# Patient Record
Sex: Female | Born: 1990 | Race: Black or African American | Hispanic: No | Marital: Single | Smoking: Never smoker
Health system: Southern US, Community
[De-identification: ages and names within clinical notes are randomized; demographics above are authoritative.]

## PROBLEM LIST (undated history)

## (undated) DIAGNOSIS — R29898 Other symptoms and signs involving the musculoskeletal system: Secondary | ICD-10-CM

## (undated) DIAGNOSIS — A609 Anogenital herpesviral infection, unspecified: Secondary | ICD-10-CM

## (undated) DIAGNOSIS — F53 Postpartum depression: Secondary | ICD-10-CM

## (undated) DIAGNOSIS — Z789 Other specified health status: Secondary | ICD-10-CM

## (undated) HISTORY — DX: Postpartum depression: F53.0

## (undated) HISTORY — DX: Other symptoms and signs involving the musculoskeletal system: R29.898

---
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
## 2010-03-08 ENCOUNTER — Other Ambulatory Visit (HOSPITAL_COMMUNITY)
Admission: RE | Admit: 2010-03-08 | Discharge: 2010-03-08 | Disposition: A | Discharge status: Home / Self Care | Payer: Self-pay | Source: Ambulatory Visit | Attending: Obstetrics and Gynecology | Admitting: Obstetrics and Gynecology

## 2010-03-08 ENCOUNTER — Other Ambulatory Visit: Payer: Self-pay | Admitting: Obstetrics and Gynecology

## 2010-03-08 DIAGNOSIS — Z113 Encounter for screening for infections with a predominantly sexual mode of transmission: Secondary | ICD-10-CM | POA: Insufficient documentation

## 2010-03-08 DIAGNOSIS — Z01419 Encounter for gynecological examination (general) (routine) without abnormal findings: Secondary | ICD-10-CM | POA: Insufficient documentation

## 2010-05-30 NOTE — Op Note (Signed)
Jillian Ruiz, Jillian Ruiz              ACCOUNT NO.:  [id number]   MEDICAL RECORD NO.:  [id number]          PATIENT TYPE:  INP   LOCATION:  9137                          FACILITY:  [facility]   PHYSICIAN:  Gerald Leitz, MD          DATE OF BIRTH:  05/09/1990   DATE OF PROCEDURE:  02/06/2007  DATE OF DISCHARGE:                               OPERATIVE REPORT   PREOPERATIVE DIAGNOSES:  1. 39-2/7-week intrauterine pregnancy.  2. Active labor.  3. Nonreassuring fetal heart rate.   POSTOPERATIVE DIAGNOSES:  1. 39-2/7-week intrauterine pregnancy.  2. Active labor.  3. Nonreassuring fetal heart rate.   PROCEDURE:  Primary low transverse cesarean section.   SURGEON:  Dr. Gerald Leitz.   ASSISTANT:  Charles A. Sydnee Cabal, MD   ANESTHESIA:  General.   FINDINGS:  Female infant, cephalic presentation.  Apgars were 8 and 9 at  one and five minutes respectively.   ESTIMATED BLOOD LOSS:  600 mL.   FLUIDS:  3000s of lactated Ringer's.   URINE OUTPUT:  300 mL.   COMPLICATIONS:  None.   SPECIMEN:  Placenta.   DISPOSITION:  Specimen sent to pathology.   INDICATIONS:  This is a an 20 year old G1 at 39-2/7 weeks intrauterine  pregnancy in active labor with a maximal cervical dilatation of 4 cm.  She developed fetal decelerations to the 90s lasting for four minutes.  These were recurrent, she was consented for cesarean section.   PROCEDURE:  The patient was taken to the operating room where spinal  anesthesia was attempted unsuccessfully.  She was then placed under  general anesthesia.  Prior to intubation the patient was prepped and  draped in the usual sterile fashion.  Once the patient was intubated the  Pfannenstiel skin incision was made with a scalpel.  This incision was  extended laterally with Mayo scissors.  The superior aspect of the  fascial incision was elevated with Kocher clamps and underside rectus  muscles were dissected off with Mayo scissors.  This was repeated on the  inferior aspect  of the fascial incision.  The rectus muscles separated  in the midline.  The peritoneum was identified and entered bluntly.  The  peritoneal incision was extended superiorly and inferiorly with good  visualization of the bladder.  Bladder blade was inserted.  Vesicouterine peritoneum was identified, tented up and entered sharply  with Metzenbaum scissors.  The incision was extended laterally.  Low  transverse incision was made with scalpel.  The infant's head was  delivered, the mouth and nose were bulb suctioned.  The cord was clamped  x2 and cut.  The infant was handed off to the waiting neonatologist.  Arterial blood gases were obtained.  The placenta was then expressed.  The uterus was exteriorized, cleared of all clot and debris.  The  uterine incision was repaired with 0-0 Vicryl in a running locked  fashion.  Excellent hemostasis was noted.  The uterus was returned to  the abdomen.  The abdomen was copiously irrigated, cleared of all clots  and debris.  Fascia was reapproximated with 0-0 PDS.  Scarpa fascia was  reapproximated with 2-0 plain gut in a running fashion.  The skin was  reapproximated with staples.  Sponge, lap and needle counts were correct  x2.  2 grams of Ancef were given at cord clamp.  The patient was  awakened from general anesthesia and taken to the recovery room awake  and in stable condition.      Gerald Leitz, MD  Electronically Signed     TC/MEDQ  D:  02/06/2007  T:  02/06/2007  Job:  [id number]

## 2010-06-02 NOTE — Discharge Summary (Signed)
NAMEVERALYN, LOPP              ACCOUNT NO.:  [id number]   MEDICAL RECORD NO.:  [id number]          PATIENT TYPE:  INP   LOCATION:  9137                          FACILITY:  [facility]   PHYSICIAN:  Gerald Leitz, MD          DATE OF BIRTH:  05/11/1990   DATE OF ADMISSION:  02/06/2007  DATE OF DISCHARGE:  02/09/2007                               DISCHARGE SUMMARY   ADMISSION DIAGNOSES:  1. A 39-2/7 week intrauterine pregnancy.  2. Active labor.   DISCHARGE DIAGNOSES:  1. A 39-2/7 week intrauterine pregnancy.  2. Active labor.  3. Nonreassuring fetal heart rate.  4. Status post low-transverse cesarean section.   HOSPITAL COURSE:  The patient was admitted on February 06, 2007, in  active labor.  She developed nonreassuring fetal heart rate with a  maximum cervical dilatation of 4 cm.  She underwent a low-transverse  cesarean section on February 06, 2007, delivered a live born female infant  in cephalic presentation with Apgars of 8 and 9 at 1 and 5 minutes  respectively.  She did well postoperatively.  Hemoglobin on postop day  #1 was 10.l with hematocrit of 29.9.  She was discharged home on postop  day #3 in stable condition on the following medications, Motrin,  Micronor, and iron sulfate as well as Percocet.  She will follow up in  our office on February 10, 2007, for staple removal and in 6 weeks for a  postpartum visit.   CONDITION ON DISCHARGE:  Stable.      Gerald Leitz, MD  Electronically Signed     TC/MEDQ  D:  04/08/2007  T:  04/09/2007  Job:  [id number]

## 2010-10-05 LAB — CBC
HCT: 29.9 — ABNORMAL LOW
HCT: 33.4 — ABNORMAL LOW
Hemoglobin: 10.1 — ABNORMAL LOW
Hemoglobin: 11.4 — ABNORMAL LOW
MCHC: 34
MCHC: 34.1
MCV: 82.6
MCV: 82.8
Platelets: 178
Platelets: 181
RBC: 3.6 — ABNORMAL LOW
RBC: 4.05
RDW: 12.9
RDW: 13.1
WBC: 10.2
WBC: 11.5 — ABNORMAL HIGH

## 2010-10-05 LAB — RPR: RPR Ser Ql: NONREACTIVE

## 2013-02-19 ENCOUNTER — Other Ambulatory Visit (HOSPITAL_COMMUNITY)
Admission: RE | Admit: 2013-02-19 | Discharge: 2013-02-19 | Disposition: A | Discharge status: Home / Self Care | Payer: PRIVATE HEALTH INSURANCE | Source: Ambulatory Visit | Attending: Obstetrics and Gynecology | Admitting: Obstetrics and Gynecology

## 2013-02-19 ENCOUNTER — Other Ambulatory Visit: Payer: Self-pay | Admitting: Obstetrics and Gynecology

## 2013-02-19 DIAGNOSIS — Z1151 Encounter for screening for human papillomavirus (HPV): Secondary | ICD-10-CM | POA: Insufficient documentation

## 2013-02-19 DIAGNOSIS — Z124 Encounter for screening for malignant neoplasm of cervix: Secondary | ICD-10-CM | POA: Insufficient documentation

## 2013-02-19 DIAGNOSIS — Z113 Encounter for screening for infections with a predominantly sexual mode of transmission: Secondary | ICD-10-CM | POA: Insufficient documentation

## 2013-06-26 LAB — OB RESULTS CONSOLE ABO/RH: RH TYPE: POSITIVE

## 2013-06-26 LAB — OB RESULTS CONSOLE HIV ANTIBODY (ROUTINE TESTING): HIV: NONREACTIVE

## 2013-06-26 LAB — OB RESULTS CONSOLE ANTIBODY SCREEN: Antibody Screen: NEGATIVE

## 2013-07-22 LAB — OB RESULTS CONSOLE GC/CHLAMYDIA
Chlamydia: NEGATIVE
Gonorrhea: NEGATIVE

## 2013-09-06 ENCOUNTER — Telehealth: Payer: Self-pay | Admitting: Obstetrics and Gynecology

## 2013-09-06 NOTE — Telephone Encounter (Signed)
TC from patient--18 weeks, G2P1, patient of Dr. Richardson Dopp. 3 days of episodes of sweating, shaking, "feeling bad". Reports fever to 102.1 on Friday, but no recurrence.  Did not call office. Able to drink fluids, has been able to eat, but has less appetite this weekend than usual. Denies vomiting, has had mild nausea.  Denies any medical issues with diabetes, HTN, etc, prior to or during pregnancy. Reviewed possibility of viral illness, changes in blood sugar, dehydration, thyroid issues, BP issues as possible causes. Offered evaluation in MAU today, or evaluation at Dr. Dawayne Patricia office tomorrow. Patient elects office visit tomorrow.  I will call office in the am and inform of patient call, and request they call patient to f/u and plan evaluation. Patient to call prior to the am if any worsening of sx.  Nigel Bridgeman, CNM 09/06/13 5:55p

## 2013-11-17 LAB — OB RESULTS CONSOLE RPR: RPR: NONREACTIVE

## 2014-01-11 ENCOUNTER — Inpatient Hospital Stay (HOSPITAL_COMMUNITY)
Admission: AD | Admit: 2014-01-11 | Discharge: 2014-01-11 | Disposition: A | Discharge status: Home / Self Care | Payer: Medicaid Other | Source: Ambulatory Visit | Attending: Obstetrics & Gynecology | Admitting: Obstetrics & Gynecology

## 2014-01-11 ENCOUNTER — Encounter (HOSPITAL_COMMUNITY): Payer: Self-pay | Admitting: *Deleted

## 2014-01-11 DIAGNOSIS — O9989 Other specified diseases and conditions complicating pregnancy, childbirth and the puerperium: Secondary | ICD-10-CM | POA: Diagnosis present

## 2014-01-11 DIAGNOSIS — Z3A36 36 weeks gestation of pregnancy: Secondary | ICD-10-CM | POA: Diagnosis not present

## 2014-01-11 DIAGNOSIS — R1032 Left lower quadrant pain: Secondary | ICD-10-CM | POA: Insufficient documentation

## 2014-01-11 HISTORY — DX: Other specified health status: Z78.9

## 2014-01-11 NOTE — MAU Note (Signed)
Pt states has had BH ctx's earlier in the week, however none at present. Intermittent twinges.

## 2014-01-11 NOTE — MAU Provider Note (Signed)
Ms. Jillian Ruiz is a 23 y.o. G2P1001 at 423w2d  who presents to MAU today complaining LOF since earlier tonight. The patient states light spotting a few days ago, but denies bleeding today. She states mild occasional LLQ cramping. She denies contractions or complications with this pregnancy. She states a history of emergency C/S with last pregnancy for decreased FHR. She reports good fetal movement.   BP 120/66 mmHg  Pulse 88  Temp(Src) 99.6 F (37.6 C) (Oral)  Resp 18  Ht 5\' 5"  (1.651 m)  Wt 256 lb (116.121 kg)  BMI 42.60 kg/m2  SpO2 100% GENERAL: Well-developed, well-nourished female in no acute distress.  HEENT: Normocephalic, atraumatic.  LUNGS: Normal effort HEART: Regular rate  ABDOMEN: Soft, nontender, nondistended.  PELVIC: Normal external female genitalia. Vagina is pink and rugated.  Small amount of thin white discharge with mild odor. Negative pooling. Gravid uterus.   EXTREMITIES: No cyanosis, clubbing, or edema  Fern - negative  Fetal Monitoring:  Baseline: 120 bpm, moderate variability, + accelerations, no decelerations Contractions: none  A: Membranes intact  P: Report given to RN to contact MD on call for further instructions  Marny LowensteinJulie N Fidelis Loth, PA-C 01/11/2014 8:21 PM

## 2014-01-11 NOTE — MAU Note (Signed)
Pt states due 02/06/2014, scheduled repeat C/S 02/01/2014 with Dr. Richardson Doppole.

## 2014-01-11 NOTE — Discharge Instructions (Signed)
Premature Rupture and Preterm Premature Rupture of Membranes °A sac made up of membranes surrounds your baby in the womb (uterus). When this sac breaks before contractions or labor starts, it is called premature rupture of membranes (PROM). Rupture of membranes is also known as your water breaking. If this happens before 37 weeks, it is called preterm premature rupture of membranes (PPROM). PPROM is serious. It needs medical care right away. °CAUSES  °PROM may be caused by the membranes getting weak. This happens at the end of pregnancy. PPROM is often due to an infection, but can be caused by a number of other things.  °SIGNS OF PROM OR PPROM °· A sudden gush of fluid from the vagina. °· A slow leak of fluid from the vagina. °· Your underwear stay wet. °WHAT TO DO IF YOU THINK YOUR WATER BROKE °Call your doctor right away. You will need to go to the hospital to get checked right away. °WHAT HAPPENS IF YOU ARE TOLD YOU HAVE PROM OR PPROM? °You will have tests done at the hospital. If you have PROM, you may be given medicine to start labor (induced). This may happen if you are not having contractions within 24 hours of your water breaking. If you have PPROM and are not having contractions, you may be given medicine to start labor. It will depend on how far along you are in your pregnancy. °If you have PPROM, you: °· And your baby will be watched closely for signs of infection or other problems. °· May be given an antibiotic medicine. This can stop an infection from starting. °· May be given a steroid medicine. This can help the lungs to develop faster. °· May be given a medicine to stop early labor (preterm labor). °· May be told to stay in bed except to use the restroom (bed rest). °· May be given medicine to start labor. This can happen if there are problems with you or the baby. °Your treatment will depend on many factors. °Document Released: 03/30/2008 Document Revised: 09/03/2012 Document Reviewed:  04/22/2012 °[employer]® Patient Information ©2015 [employer]. This information is not intended to replace advice given to you by your health care provider. Make sure you discuss any questions you have with your health care provider. ° °

## 2014-01-11 NOTE — MAU Note (Signed)
?   Leaking started at 1645- clear fluid small amts still coming, no bleeding, some twinges- no pain

## 2014-01-29 ENCOUNTER — Encounter (HOSPITAL_COMMUNITY)
Admission: RE | Admit: 2014-01-29 | Discharge: 2014-01-29 | Disposition: A | Discharge status: Home / Self Care | Payer: Medicaid Other | Source: Ambulatory Visit | Attending: Obstetrics and Gynecology | Admitting: Obstetrics and Gynecology

## 2014-01-29 ENCOUNTER — Encounter (HOSPITAL_COMMUNITY): Payer: Self-pay

## 2014-01-29 LAB — CBC
HEMATOCRIT: 33.2 % — AB (ref 36.0–46.0)
Hemoglobin: 11 g/dL — ABNORMAL LOW (ref 12.0–15.0)
MCH: 27.2 pg (ref 26.0–34.0)
MCHC: 33.1 g/dL (ref 30.0–36.0)
MCV: 82.2 fL (ref 78.0–100.0)
Platelets: 171 10*3/uL (ref 150–400)
RBC: 4.04 MIL/uL (ref 3.87–5.11)
RDW: 12.8 % (ref 11.5–15.5)
WBC: 7.9 10*3/uL (ref 4.0–10.5)

## 2014-01-29 LAB — ABO/RH: ABO/RH(D): B POS

## 2014-01-29 LAB — TYPE AND SCREEN
ABO/RH(D): B POS
Antibody Screen: NEGATIVE

## 2014-01-29 NOTE — Patient Instructions (Signed)
   Your procedure is scheduled on: JAN 18 AT 730AM  Enter through the [facility]trance of [facility] at: 6AM Pick up the phone at the desk and dial [phone] and inform us of your arrival.  Please call this number if you have any problems the morning of surgery: [phone]  Remember: Do not eat food after midnight:JAN 17 Do not drink clear liquids after: JAN 17 Take these medicines the morning of surgery with a SIP OF WATER: NONE  Do not wear jewelry, make-up, or FINGER nail polish No metal in your hair or on your body. Do not wear lotions, powders, perfumes.  You may wear deodorant.  Do not bring valuables to the hospital. Contacts, dentures or bridgework may not be worn into surgery.  Leave suitcase in the car. After Surgery it may be brought to your room. For patients being admitted to the hospital, checkout time is 11:00am the day of discharge.    Patients discharged on the day of surgery will not be allowed to drive home.

## 2014-01-30 ENCOUNTER — Other Ambulatory Visit: Payer: Self-pay | Admitting: Obstetrics and Gynecology

## 2014-01-30 LAB — RPR: RPR Ser Ql: NONREACTIVE

## 2014-01-31 NOTE — H&P (Signed)
Jillian Ruiz is a 24 y.o. female presenting for Repeat cesarean section  At 39 wks and 2 days. Her EDD is 02/06/2014 by lmp confirmed by 11 wk ultrasound. Pregnancy has been uncomplicated. She recieves prenatal care with Dr. Gerald Leitzara Beatrice Sehgal with [facility].     History OB History    Gravida Para Term Preterm AB TAB SAB Ectopic Multiple Living   2 1 1       1      Past Medical History  Diagnosis Date  . Medical history non-contributory    Past Surgical History  Procedure Laterality Date  . Cesarean section     Family History: family history is not on file. Social History:  reports that she has never smoked. She has never used smokeless tobacco. She reports that she does not drink alcohol or use illicit drugs.   Prenatal Transfer Tool  Maternal Diabetes: No Genetic Screening: Normal Maternal Ultrasounds/Referrals: Normal Fetal Ultrasounds or other Referrals:  None Maternal Substance Abuse:  No Significant Maternal Medications:  None Significant Maternal Lab Results:  Lab values include: Group B Strep negative Other Comments:  None  Review of Systems  All other systems reviewed and are negative.     There were no vitals taken for this visit. Maternal Exam:  Abdomen: Surgical scars: low transverse.   Estimated fetal weight is 7 lb 8 oz .   Fetal presentation: vertex  Introitus: Normal vulva. Normal vagina.    Fetal Exam Fetal Monitor Review: Mode: hand-held doppler probe.   Baseline rate: 145.      Physical Exam  Vitals reviewed. Constitutional: She is oriented to person, place, and time. She appears well-developed and well-nourished.  HENT:  Head: Normocephalic.  Cardiovascular: Normal rate and regular rhythm.   Respiratory: Effort normal and breath sounds normal.  GI: There is no tenderness.  Musculoskeletal: Normal range of motion.  Neurological: She is alert and oriented to person, place, and time.  Skin: Skin is warm and dry.  Psychiatric: She has a normal  mood and affect.    Prenatal labs: ABO, Rh: --/--/B POS, B POS (01/15 16100922) Antibody: NEG (01/15 0922) Rubella:  Immune   RPR: Non Reactive (01/15 0923)  HBsAg:   Negative  HIV: Non-reactive (06/12 0000)  GBS:   Negative   Assessment/Plan: 39 wks and 2 days with h/o cesarean section pt desires repeat. R/b/a of cesarean section discussed with the patient including but not limited to infection / bleeding / damage to bowel bladder baby with need for further surgery. R/O tranfusion discussed.. Pt voiced understanding and desires to proceed.    Kebrina Friend J. 01/31/2014, 7:16 PM

## 2014-02-01 ENCOUNTER — Inpatient Hospital Stay (HOSPITAL_COMMUNITY)
Admission: RE | Admit: 2014-02-01 | Discharge: 2014-02-03 | DRG: 765 | Disposition: A | Discharge status: Home / Self Care | Payer: Medicaid Other | Source: Ambulatory Visit | Attending: Obstetrics and Gynecology | Admitting: Obstetrics and Gynecology

## 2014-02-01 ENCOUNTER — Encounter (HOSPITAL_COMMUNITY): Payer: Self-pay

## 2014-02-01 ENCOUNTER — Inpatient Hospital Stay (HOSPITAL_COMMUNITY): Payer: Medicaid Other | Admitting: Anesthesiology

## 2014-02-01 ENCOUNTER — Encounter (HOSPITAL_COMMUNITY)
Admission: RE | Disposition: A | Discharge status: Home / Self Care | Payer: Self-pay | Source: Ambulatory Visit | Attending: Obstetrics and Gynecology

## 2014-02-01 DIAGNOSIS — O99214 Obesity complicating childbirth: Secondary | ICD-10-CM | POA: Diagnosis present

## 2014-02-01 DIAGNOSIS — Z3A39 39 weeks gestation of pregnancy: Secondary | ICD-10-CM | POA: Diagnosis present

## 2014-02-01 DIAGNOSIS — O3421 Maternal care for scar from previous cesarean delivery: Principal | ICD-10-CM | POA: Diagnosis present

## 2014-02-01 DIAGNOSIS — Z98891 History of uterine scar from previous surgery: Secondary | ICD-10-CM | POA: Insufficient documentation

## 2014-02-01 DIAGNOSIS — Z6841 Body Mass Index (BMI) 40.0 and over, adult: Secondary | ICD-10-CM

## 2014-02-01 SURGERY — Surgical Case
Anesthesia: Spinal

## 2014-02-01 MED ORDER — IBUPROFEN 600 MG PO TABS
600.0000 mg | ORAL_TABLET | Freq: Four times a day (QID) | ORAL | Status: DC
  Administered 2014-02-01 – 2014-02-03 (×6): 600 mg via ORAL
  Filled 2014-02-01 (×7): qty 1

## 2014-02-01 MED ORDER — FENTANYL CITRATE 0.05 MG/ML IJ SOLN
INTRAMUSCULAR | Status: AC
  Filled 2014-02-01: qty 2

## 2014-02-01 MED ORDER — SCOPOLAMINE 1 MG/3DAYS TD PT72
1.0000 | MEDICATED_PATCH | Freq: Once | TRANSDERMAL | Status: DC
  Filled 2014-02-01: qty 1

## 2014-02-01 MED ORDER — NALBUPHINE HCL 10 MG/ML IJ SOLN: 5.0000 mg | Freq: Once | INTRAMUSCULAR | Status: AC | PRN

## 2014-02-01 MED ORDER — LANOLIN HYDROUS EX OINT: 1.0000 "application " | TOPICAL_OINTMENT | CUTANEOUS | Status: DC | PRN

## 2014-02-01 MED ORDER — MORPHINE SULFATE 0.5 MG/ML IJ SOLN
INTRAMUSCULAR | Status: AC
  Filled 2014-02-01: qty 10

## 2014-02-01 MED ORDER — WITCH HAZEL-GLYCERIN EX PADS: 1.0000 "application " | MEDICATED_PAD | CUTANEOUS | Status: DC | PRN

## 2014-02-01 MED ORDER — MENTHOL 3 MG MT LOZG: 1.0000 | LOZENGE | OROMUCOSAL | Status: DC | PRN

## 2014-02-01 MED ORDER — MORPHINE SULFATE (PF) 0.5 MG/ML IJ SOLN
INTRAMUSCULAR | Status: DC | PRN
  Administered 2014-02-01: .2 mg via INTRATHECAL

## 2014-02-01 MED ORDER — SIMETHICONE 80 MG PO CHEW
80.0000 mg | CHEWABLE_TABLET | Freq: Three times a day (TID) | ORAL | Status: DC
  Administered 2014-02-01 – 2014-02-03 (×5): 80 mg via ORAL
  Filled 2014-02-01 (×5): qty 1

## 2014-02-01 MED ORDER — ONDANSETRON HCL 4 MG/2ML IJ SOLN
4.0000 mg | Freq: Three times a day (TID) | INTRAMUSCULAR | Status: DC | PRN
  Administered 2014-02-01: 4 mg via INTRAVENOUS

## 2014-02-01 MED ORDER — SODIUM CHLORIDE 0.9 % IJ SOLN: 3.0000 mL | INTRAMUSCULAR | Status: DC | PRN

## 2014-02-01 MED ORDER — DIPHENHYDRAMINE HCL 25 MG PO CAPS
25.0000 mg | ORAL_CAPSULE | Freq: Four times a day (QID) | ORAL | Status: DC | PRN
  Administered 2014-02-01: 25 mg via ORAL
  Filled 2014-02-01: qty 1

## 2014-02-01 MED ORDER — BUPIVACAINE IN DEXTROSE 0.75-8.25 % IT SOLN
INTRATHECAL | Status: DC | PRN
  Administered 2014-02-01: 1.8 mg via INTRATHECAL

## 2014-02-01 MED ORDER — OXYTOCIN 10 UNIT/ML IJ SOLN
INTRAMUSCULAR | Status: AC
  Filled 2014-02-01: qty 4

## 2014-02-01 MED ORDER — LACTATED RINGERS IV SOLN
INTRAVENOUS | Status: DC
  Administered 2014-02-01 – 2014-02-02 (×2): via INTRAVENOUS

## 2014-02-01 MED ORDER — KETOROLAC TROMETHAMINE 30 MG/ML IJ SOLN
30.0000 mg | Freq: Four times a day (QID) | INTRAMUSCULAR | Status: AC | PRN
  Administered 2014-02-01: 30 mg via INTRAVENOUS
  Filled 2014-02-01: qty 1

## 2014-02-01 MED ORDER — METHYLERGONOVINE MALEATE 0.2 MG/ML IJ SOLN: 0.2000 mg | INTRAMUSCULAR | Status: DC | PRN

## 2014-02-01 MED ORDER — OXYCODONE-ACETAMINOPHEN 5-325 MG PO TABS
1.0000 | ORAL_TABLET | ORAL | Status: DC | PRN
  Administered 2014-02-02 (×4): 1 via ORAL
  Filled 2014-02-01 (×4): qty 1

## 2014-02-01 MED ORDER — PHENYLEPHRINE 8 MG IN D5W 100 ML (0.08MG/ML) PREMIX OPTIME
INJECTION | INTRAVENOUS | Status: AC
  Filled 2014-02-01: qty 100

## 2014-02-01 MED ORDER — FENTANYL CITRATE 0.05 MG/ML IJ SOLN: 25.0000 ug | INTRAMUSCULAR | Status: DC | PRN

## 2014-02-01 MED ORDER — ONDANSETRON HCL 4 MG/2ML IJ SOLN
INTRAMUSCULAR | Status: AC
  Filled 2014-02-01: qty 2

## 2014-02-01 MED ORDER — NALBUPHINE HCL 10 MG/ML IJ SOLN: 5.0000 mg | INTRAMUSCULAR | Status: DC | PRN

## 2014-02-01 MED ORDER — DIBUCAINE 1 % RE OINT: 1.0000 "application " | TOPICAL_OINTMENT | RECTAL | Status: DC | PRN

## 2014-02-01 MED ORDER — CEFAZOLIN SODIUM-DEXTROSE 2-3 GM-% IV SOLR
2.0000 g | INTRAVENOUS | Status: AC
  Administered 2014-02-01: 2 g via INTRAVENOUS

## 2014-02-01 MED ORDER — CEFAZOLIN SODIUM-DEXTROSE 2-3 GM-% IV SOLR
INTRAVENOUS | Status: AC
  Filled 2014-02-01: qty 50

## 2014-02-01 MED ORDER — LACTATED RINGERS IV SOLN
INTRAVENOUS | Status: DC | PRN
  Administered 2014-02-01: 08:00:00 via INTRAVENOUS

## 2014-02-01 MED ORDER — MEPERIDINE HCL 25 MG/ML IJ SOLN: 6.2500 mg | INTRAMUSCULAR | Status: DC | PRN

## 2014-02-01 MED ORDER — KETOROLAC TROMETHAMINE 30 MG/ML IJ SOLN: 30.0000 mg | Freq: Four times a day (QID) | INTRAMUSCULAR | Status: AC | PRN

## 2014-02-01 MED ORDER — OXYTOCIN 40 UNITS IN LACTATED RINGERS INFUSION - SIMPLE MED: 62.5000 mL/h | INTRAVENOUS | Status: AC

## 2014-02-01 MED ORDER — DIPHENHYDRAMINE HCL 50 MG/ML IJ SOLN: 12.5000 mg | INTRAMUSCULAR | Status: DC | PRN

## 2014-02-01 MED ORDER — ONDANSETRON HCL 4 MG/2ML IJ SOLN
4.0000 mg | INTRAMUSCULAR | Status: DC | PRN
Start: 2014-02-01 — End: 2014-02-03

## 2014-02-01 MED ORDER — ZOLPIDEM TARTRATE 5 MG PO TABS: 5.0000 mg | ORAL_TABLET | Freq: Every evening | ORAL | Status: DC | PRN

## 2014-02-01 MED ORDER — LACTATED RINGERS IV SOLN
Freq: Once | INTRAVENOUS | Status: AC
  Administered 2014-02-01: 07:00:00 via INTRAVENOUS

## 2014-02-01 MED ORDER — FERROUS SULFATE 325 (65 FE) MG PO TABS
325.0000 mg | ORAL_TABLET | Freq: Two times a day (BID) | ORAL | Status: DC
  Administered 2014-02-01 – 2014-02-03 (×4): 325 mg via ORAL
  Filled 2014-02-01 (×4): qty 1

## 2014-02-01 MED ORDER — PHENYLEPHRINE 8 MG IN D5W 100 ML (0.08MG/ML) PREMIX OPTIME
INJECTION | INTRAVENOUS | Status: DC | PRN
  Administered 2014-02-01: 60 ug/min via INTRAVENOUS

## 2014-02-01 MED ORDER — LACTATED RINGERS IV SOLN
INTRAVENOUS | Status: DC | PRN
  Administered 2014-02-01 (×2): via INTRAVENOUS

## 2014-02-01 MED ORDER — ONDANSETRON HCL 4 MG PO TABS: 4.0000 mg | ORAL_TABLET | ORAL | Status: DC | PRN

## 2014-02-01 MED ORDER — OXYTOCIN 10 UNIT/ML IJ SOLN
40.0000 [IU] | INTRAVENOUS | Status: DC | PRN
  Administered 2014-02-01: 40 [IU] via INTRAVENOUS

## 2014-02-01 MED ORDER — PRENATAL MULTIVITAMIN CH
1.0000 | ORAL_TABLET | Freq: Every day | ORAL | Status: DC
  Administered 2014-02-02: 1 via ORAL
  Filled 2014-02-01: qty 1

## 2014-02-01 MED ORDER — FENTANYL CITRATE 0.05 MG/ML IJ SOLN
INTRAMUSCULAR | Status: DC | PRN
  Administered 2014-02-01: 10 ug via INTRATHECAL

## 2014-02-01 MED ORDER — SIMETHICONE 80 MG PO CHEW: 80.0000 mg | CHEWABLE_TABLET | ORAL | Status: DC | PRN

## 2014-02-01 MED ORDER — SCOPOLAMINE 1 MG/3DAYS TD PT72
1.0000 | MEDICATED_PATCH | Freq: Once | TRANSDERMAL | Status: DC
  Administered 2014-02-01: 1.5 mg via TRANSDERMAL

## 2014-02-01 MED ORDER — NALOXONE HCL 1 MG/ML IJ SOLN
1.0000 ug/kg/h | INTRAMUSCULAR | Status: DC | PRN
  Filled 2014-02-01: qty 2

## 2014-02-01 MED ORDER — METHYLERGONOVINE MALEATE 0.2 MG PO TABS: 0.2000 mg | ORAL_TABLET | ORAL | Status: DC | PRN

## 2014-02-01 MED ORDER — MEDROXYPROGESTERONE ACETATE 150 MG/ML IM SUSP: 150.0000 mg | INTRAMUSCULAR | Status: DC | PRN

## 2014-02-01 MED ORDER — SCOPOLAMINE 1 MG/3DAYS TD PT72
MEDICATED_PATCH | TRANSDERMAL | Status: AC
  Administered 2014-02-01: 1.5 mg via TRANSDERMAL
  Filled 2014-02-01: qty 1

## 2014-02-01 MED ORDER — SIMETHICONE 80 MG PO CHEW
80.0000 mg | CHEWABLE_TABLET | ORAL | Status: DC
  Administered 2014-02-01 – 2014-02-02 (×2): 80 mg via ORAL
  Filled 2014-02-01 (×2): qty 1

## 2014-02-01 MED ORDER — SENNOSIDES-DOCUSATE SODIUM 8.6-50 MG PO TABS
2.0000 | ORAL_TABLET | ORAL | Status: DC
  Administered 2014-02-01 – 2014-02-02 (×2): 2 via ORAL
  Filled 2014-02-01 (×2): qty 2

## 2014-02-01 MED ORDER — NALOXONE HCL 0.4 MG/ML IJ SOLN: 0.4000 mg | INTRAMUSCULAR | Status: DC | PRN

## 2014-02-01 MED ORDER — ONDANSETRON HCL 4 MG/2ML IJ SOLN: 4.0000 mg | Freq: Once | INTRAMUSCULAR | Status: AC | PRN

## 2014-02-01 MED ORDER — ONDANSETRON HCL 4 MG/2ML IJ SOLN
INTRAMUSCULAR | Status: DC | PRN
  Administered 2014-02-01: 4 mg via INTRAVENOUS

## 2014-02-01 MED ORDER — DIPHENHYDRAMINE HCL 25 MG PO CAPS: 25.0000 mg | ORAL_CAPSULE | ORAL | Status: DC | PRN

## 2014-02-01 MED ORDER — OXYCODONE-ACETAMINOPHEN 5-325 MG PO TABS
2.0000 | ORAL_TABLET | ORAL | Status: DC | PRN
  Administered 2014-02-03: 2 via ORAL
  Filled 2014-02-01 (×2): qty 2

## 2014-02-01 SURGICAL SUPPLY — 47 items
APL SKNCLS STERI-STRIP NONHPOA (GAUZE/BANDAGES/DRESSINGS) ×1
BARRIER ADHS 3X4 INTERCEED (GAUZE/BANDAGES/DRESSINGS) ×2 IMPLANT
BENZOIN TINCTURE PRP APPL 2/3 (GAUZE/BANDAGES/DRESSINGS) ×3 IMPLANT
BRR ADH 4X3 ABS CNTRL BYND (GAUZE/BANDAGES/DRESSINGS) ×1
CLAMP CORD UMBIL (MISCELLANEOUS) IMPLANT
CLOSURE WOUND 1/2 X4 (GAUZE/BANDAGES/DRESSINGS) ×1
CLOTH BEACON ORANGE TIMEOUT ST (SAFETY) ×3 IMPLANT
CONTAINER PREFILL 10% NBF 15ML (MISCELLANEOUS) IMPLANT
DRAPE SHEET LG 3/4 BI-LAMINATE (DRAPES) IMPLANT
DRSG OPSITE POSTOP 4X10 (GAUZE/BANDAGES/DRESSINGS) ×3 IMPLANT
DURAPREP 26ML APPLICATOR (WOUND CARE) ×3 IMPLANT
ELECT REM PT RETURN 9FT ADLT (ELECTROSURGICAL) ×3
ELECTRODE REM PT RTRN 9FT ADLT (ELECTROSURGICAL) ×1 IMPLANT
EXTRACTOR VACUUM M CUP 4 TUBE (SUCTIONS) IMPLANT
EXTRACTOR VACUUM M CUP 4' TUBE (SUCTIONS)
GLOVE BIOGEL M 6.5 STRL (GLOVE) ×6 IMPLANT
GLOVE BIOGEL PI IND STRL 6.5 (GLOVE) ×1 IMPLANT
GLOVE BIOGEL PI INDICATOR 6.5 (GLOVE) ×2
GOWN STRL REUS W/TWL LRG LVL3 (GOWN DISPOSABLE) ×9 IMPLANT
KIT ABG SYR 3ML LUER SLIP (SYRINGE) IMPLANT
NEEDLE HYPO 25X5/8 SAFETYGLIDE (NEEDLE) IMPLANT
NS IRRIG 1000ML POUR BTL (IV SOLUTION) ×3 IMPLANT
PACK C SECTION WH (CUSTOM PROCEDURE TRAY) ×3 IMPLANT
PAD ABD 7.5X8 STRL (GAUZE/BANDAGES/DRESSINGS) ×2 IMPLANT
PAD ABD 8X7 1/2 STERILE (GAUZE/BANDAGES/DRESSINGS) ×3 IMPLANT
PAD OB MATERNITY 4.3X12.25 (PERSONAL CARE ITEMS) ×3 IMPLANT
RTRCTR C-SECT PINK 25CM LRG (MISCELLANEOUS) ×3 IMPLANT
RTRCTR C-SECT PINK 34CM XLRG (MISCELLANEOUS) IMPLANT
SPONGE GAUZE 4X4 12PLY STER LF (GAUZE/BANDAGES/DRESSINGS) ×3 IMPLANT
SPONGE LAP 18X18 X RAY DECT (DISPOSABLE) ×4 IMPLANT
STAPLER VISISTAT 35W (STAPLE) IMPLANT
STRIP CLOSURE SKIN 1/2X4 (GAUZE/BANDAGES/DRESSINGS) ×2 IMPLANT
SUT CHROMIC 2 0 CT 1 (SUTURE) ×2 IMPLANT
SUT CHROMIC 2 0 SH (SUTURE) ×2 IMPLANT
SUT PDS AB 0 CT1 27 (SUTURE) ×6 IMPLANT
SUT PLAIN 0 NONE (SUTURE) IMPLANT
SUT VIC AB 0 CTX 36 (SUTURE) ×9
SUT VIC AB 0 CTX36XBRD ANBCTRL (SUTURE) ×3 IMPLANT
SUT VIC AB 2-0 CT1 27 (SUTURE) ×3
SUT VIC AB 2-0 CT1 TAPERPNT 27 (SUTURE) ×1 IMPLANT
SUT VIC AB 3-0 SH 27 (SUTURE)
SUT VIC AB 3-0 SH 27X BRD (SUTURE) IMPLANT
SUT VIC AB 4-0 KS 27 (SUTURE) ×3 IMPLANT
SUT VICRYL 0 TIES 12 18 (SUTURE) ×2 IMPLANT
TAPE CLOTH SURG 4X10 WHT LF (GAUZE/BANDAGES/DRESSINGS) ×2 IMPLANT
TOWEL OR 17X24 6PK STRL BLUE (TOWEL DISPOSABLE) ×3 IMPLANT
TRAY FOLEY CATH 14FR (SET/KITS/TRAYS/PACK) ×3 IMPLANT

## 2014-02-01 NOTE — Anesthesia Procedure Notes (Signed)
Spinal Patient location during procedure: OR Start time: 02/01/2014 7:20 AM End time: 02/01/2014 7:25 AM Staffing Anesthesiologist: Milana Obey Performed by: anesthesiologist  Preanesthetic Checklist Completed: patient identified, site marked, surgical consent, pre-op evaluation, timeout performed, IV checked, risks and benefits discussed and monitors and equipment checked Spinal Block Patient position: sitting Prep: DuraPrep Patient monitoring: heart rate, continuous pulse ox and blood pressure Approach: midline Location: L3-4 Injection technique: single-shot Needle Needle type: Spinocan  Needle gauge: 24 G Needle length: 9 cm Assessment Sensory level: T4 Additional Notes Expiration date of kit checked and confirmed. Patient tolerated procedure well, without complications.  Functioning IV was confirmed and monitors were applied. Sterile prep and drape, including hand hygiene, mask and sterile gloves were used. The patient was positioned and the spine was prepped. The skin was anesthetized with lidocaine.  Free flow of clear CSF was obtained prior to injecting local anesthetic into the CSF.  The spinal needle aspirated freely following injection.  The needle was carefully withdrawn.  The patient tolerated the procedure well. Consent was obtained prior to procedure with all questions answered and concerns addressed. Risks including but not limited to bleeding, infection, nerve damage, paralysis, failed block, inadequate analgesia, allergic reaction, high spinal, itching and headache were discussed and the patient wished to proceed.   Lauretta Grill, MD

## 2014-02-01 NOTE — Op Note (Signed)
Cesarean Section Procedure Note  Indications: previous uterine incision low transverse cesarean section   Pre-operative Diagnosis: 39 week 2 day pregnancy.  Post-operative Diagnosis: same  Surgeon: Jessee AversOLE,Cayden Rautio J.   Assistants: Dr. Myna HidalgoJennifer Ozan assisted due to complexity of the case and concern for adhesions from previous surgery.   Anesthesia: Spinal anesthesia  ASA Class: 2   Procedure Details   The patient was seen in the Holding Room. The risks, benefits, complications, treatment options, and expected outcomes were discussed with the patient.  The patient concurred with the proposed plan, giving informed consent.  The site of surgery properly noted/marked. The patient was taken to Operating Room # 9, identified as Jillian Ruiz and the procedure verified as C-Section Delivery. A Time Out was held and the above information confirmed.  After induction of anesthesia, the patient was draped and prepped in the usual sterile manner. A Pfannenstiel incision was made and carried down through the subcutaneous tissue to the fascia. Fascial incision was made and extended transversely. The fascia was separated from the underlying rectus tissue superiorly and inferiorly. The peritoneum was identified and entered. Adhesions of the omentum to the peritoneum were doubly clamped transected and suture ligated with 0 vicryl.Once the adhesions were released. The  Peritoneal incision was extended longitudinally. An Alexis retractor was placed. The utero-vesical peritoneal reflection was incised transversely and the bladder flap was bluntly freed from the lower uterine segment. A low transverse uterine incision was made. Delivered from cephalic presentation was a   6 lb 15 oz   Female with Apgar scores of 9 at one minute and 9 at five minutes. After the umbilical cord was clamped and cut cord blood was obtained for evaluation. The placenta was removed intact and appeared normal. The uterine outline, tubes and  ovaries appeared normal. The uterine incision was closed with running locked sutures of 0 vicryl . Hemostasis was observed. Lavage was carried out until clear. Interseed was placed along the uterine incision. The muscle was reapproximated with 2-0 vicryl. The fascia was then reapproximated with running sutures of 0 pds.  The subcutaneous layer was reapproximated with 2-0 vicryl. The skin was reapproximated with 4-0 vicryl .  Instrument, sponge, and needle counts were correct prior the abdominal closure and at the conclusion of the case.   Findings: Female infant in the cephalic presentation. Normal fallopian tubes and ovaries. Adhesions of the omentum to the peritoneum. Adhesions of the bladder to the uterus above the lower uterine segment.   Estimated Blood Loss:  800 mL         Drains: Foley          Total IV Fluids:  Per anesthesia ml         Specimens: Placenta and sent to labor and delivery            Implants: None         Complications:  None; patient tolerated the procedure well.         Disposition: PACU - hemodynamically stable.         Condition: stable  Attending Attestation: I performed the procedure.

## 2014-02-01 NOTE — Transfer of Care (Signed)
Immediate Anesthesia Transfer of Care Note  Patient: Jillian Ruiz  Procedure(s) Performed: Procedure(s): REPEAT CESAREAN SECTION (N/A)  Patient Location: PACU  Anesthesia Type:Spinal  Level of Consciousness: awake, alert  and oriented  Airway & Oxygen Therapy: Patient Spontanous Breathing  Post-op Assessment: Report given to PACU RN  Post vital signs: Reviewed  Complications: No apparent anesthesia complications

## 2014-02-01 NOTE — Anesthesia Preprocedure Evaluation (Signed)
Anesthesia Evaluation  Patient identified by MRN, date of birth, ID band Patient awake    Reviewed: Allergy & Precautions, NPO status , Patient's Chart, lab work & pertinent test results  History of Anesthesia Complications Negative for: history of anesthetic complications  Airway Mallampati: III  TM Distance: >3 FB Neck ROM: Full    Dental no notable dental hx. (+) Dental Advisory Given   Pulmonary neg pulmonary ROS,  breath sounds clear to auscultation  Pulmonary exam normal       Cardiovascular negative cardio ROS  Rhythm:Regular Rate:Normal     Neuro/Psych negative neurological ROS  negative psych ROS   GI/Hepatic negative GI ROS, Neg liver ROS,   Endo/Other  Morbid obesity  Renal/GU negative Renal ROS  negative genitourinary   Musculoskeletal negative musculoskeletal ROS (+)   Abdominal   Peds negative pediatric ROS (+)  Hematology negative hematology ROS (+)   Anesthesia Other Findings   Reproductive/Obstetrics (+) Pregnancy                             Anesthesia Physical Anesthesia Plan  ASA: III  Anesthesia Plan: Spinal   Post-op Pain Management:    Induction:   Airway Management Planned:   Additional Equipment:   Intra-op Plan:   Post-operative Plan:   Informed Consent: I have reviewed the patients History and Physical, chart, labs and discussed the procedure including the risks, benefits and alternatives for the proposed anesthesia with the patient or authorized representative who has indicated his/her understanding and acceptance.   Dental advisory given  Plan Discussed with: CRNA  Anesthesia Plan Comments:         Anesthesia Quick Evaluation

## 2014-02-01 NOTE — Anesthesia Postprocedure Evaluation (Signed)
  Anesthesia Post-op Note  Patient: Jillian Ruiz  Procedure(s) Performed: Procedure(s) (LRB): REPEAT CESAREAN SECTION (N/A)  Patient Location: PACU  Anesthesia Type: Spinal  Level of Consciousness: awake and alert   Airway and Oxygen Therapy: Patient Spontanous Breathing  Post-op Pain: mild  Post-op Assessment: Post-op Vital signs reviewed, Patient's Cardiovascular Status Stable, Respiratory Function Stable, Patent Airway and No signs of Nausea or vomiting  Last Vitals:  Filed Vitals:   02/01/14 1030  BP: 91/54  Pulse: 69  Temp: 36.5 C  Resp: 18    Post-op Vital Signs: stable   Complications: No apparent anesthesia complications

## 2014-02-01 NOTE — H&P (Signed)
  Date of Initial H&P: 11/22/2014  History reviewed, patient examined, no change in status, stable for surgery. 

## 2014-02-01 NOTE — Lactation Note (Signed)
This note was copied from the chart of Jillian Bluford Mainshley Keckler. Lactation Consultation Note  Patient Name: Jillian Ruiz ZOXWR'UToday's Date: 02/01/2014  Pecola LeisureBaby is 6 hours old and has been to the breast several times and per mom recently breast fed for 60 mins. Per mom the baby was ina consistent pattern , with swallows. Dad changed a mec diaper when the LC present  And baby went back to sleep.  LC showed mom how to hand express, with her permission , several drops of Colostrum noted. LC explained the importance of having latch assessed qshift [facility] or LC .  Mother informed of post-discharge support and given phone number to the lactation department, including services for phone  call assistance; out-patient appointments; and breastfeeding support group. List of other breastfeeding resources in the community given  in the handout. Encouraged mother to call for problems or concerns related to breastfeeding.     Maternal Data Does the patient have breastfeeding experience prior to this delivery?: Yes  Feeding Feeding Type:  (baby recenly breast fed ) Length of feed: 30 min  LATCH Score/Interventions                Intervention(s): Breastfeeding basics reviewed     Lactation Tools Discussed/Used WIC Program: Yes (pe rmom [place] )   Consult Status Consult Status: Follow-up Date: 02/01/14 Follow-up type: In-patient    Kathrin Greathouseorio, Pravin Perezperez Ann 02/01/2014, 2:29 PM

## 2014-02-02 ENCOUNTER — Encounter (HOSPITAL_COMMUNITY): Payer: Self-pay | Admitting: Obstetrics and Gynecology

## 2014-02-02 LAB — CBC
HEMATOCRIT: 29.5 % — AB (ref 36.0–46.0)
HEMOGLOBIN: 10 g/dL — AB (ref 12.0–15.0)
MCH: 28 pg (ref 26.0–34.0)
MCHC: 33.9 g/dL (ref 30.0–36.0)
MCV: 82.6 fL (ref 78.0–100.0)
Platelets: 176 10*3/uL (ref 150–400)
RBC: 3.57 MIL/uL — ABNORMAL LOW (ref 3.87–5.11)
RDW: 12.5 % (ref 11.5–15.5)
WBC: 8.6 10*3/uL (ref 4.0–10.5)

## 2014-02-02 LAB — BIRTH TISSUE RECOVERY COLLECTION (PLACENTA DONATION)

## 2014-02-02 NOTE — Progress Notes (Signed)
Subjective: Postpartum Day 1: Cesarean Delivery Patient reports tolerating PO, + flatus and no problems voiding.    Objective: Vital signs in last 24 hours: Temp:  [97.9 F (36.6 C)-98.8 F (37.1 C)] 97.9 F (36.6 C) (01/19 0600) Pulse Rate:  [72-78] 72 (01/19 0600) Resp:  [18-20] 20 (01/19 0600) BP: (96-101)/(50-56) 101/52 mmHg (01/19 0600) SpO2:  [97 %-99 %] 99 % (01/19 0600)  Physical Exam:  General: alert and cooperative Lochia: appropriate Uterine Fundus: firm Incision: healing well DVT Evaluation: No evidence of DVT seen on physical exam.   Recent Labs  02/02/14 0540  HGB 10.0*  HCT 29.5*    Assessment/Plan: Status post Cesarean section. Doing well postoperatively.  Continue current care.  Jillian Ruiz J. 02/02/2014, 5:14 PM

## 2014-02-02 NOTE — Anesthesia Postprocedure Evaluation (Signed)
  Anesthesia Post-op Note  Anesthesia Post Note  Patient: Jillian Ruiz  Procedure(s) Performed: Procedure(s) (LRB): REPEAT CESAREAN SECTION (N/A)  Anesthesia type: Spinal  Patient location: Mother/Baby  Post pain: Pain level controlled  Post assessment: Post-op Vital signs reviewed  Last Vitals:  Filed Vitals:   02/02/14 0600  BP: 101/52  Pulse: 72  Temp: 36.6 C  Resp: 20    Post vital signs: Reviewed  Level of consciousness: awake  Complications: No apparent anesthesia complications

## 2014-02-02 NOTE — Addendum Note (Signed)
Addendum  created 02/02/14 1314 by Turner DanielsJennifer L Ahmaad Neidhardt, CRNA   Modules edited: Notes Section   Notes Section:  File: [id number]

## 2014-02-03 MED ORDER — OXYCODONE-ACETAMINOPHEN 5-325 MG PO TABS: 1.0000 | ORAL_TABLET | Freq: Four times a day (QID) | ORAL | Status: DC | PRN

## 2014-02-03 MED ORDER — IBUPROFEN 600 MG PO TABS: 600.0000 mg | ORAL_TABLET | Freq: Four times a day (QID) | ORAL | Status: DC | PRN

## 2014-02-03 NOTE — Discharge Summary (Signed)
Obstetric Discharge Summary Reason for Admission: cesarean section Prenatal Procedures: none Intrapartum Procedures: cesarean: low cervical, transverse Postpartum Procedures: none Complications-Operative and Postpartum: none HEMOGLOBIN  Date Value Ref Range Status  02/02/2014 10.0* 12.0 - 15.0 g/dL Final   HCT  Date Value Ref Range Status  02/02/2014 29.5* 36.0 - 46.0 % Final    Physical Exam:  General: alert and cooperative Lochia: appropriate Uterine Fundus: firm Incision: healing well DVT Evaluation: No evidence of DVT seen on physical exam.  Discharge Diagnoses: Term Pregnancy-delivered  Discharge Information: Date: 02/03/2014 Activity: pelvic rest Diet: routine Medications: PNV, Ibuprofen and Percocet Condition: stable Instructions: refer to practice specific booklet Discharge to: home Follow-up Information    Follow up with Jessee AversOLE,Zebulin Siegel J., MD In 2 weeks.   Specialty:  Obstetrics and Gynecology   Why:  incision check - pt already has an appt for february 1st    Contact information:   [address] [place] [address] [phone]       Newborn Data: Live born female  Birth Weight: 6 lb 15.1 oz (3150 g) APGAR: 8, 8  Home with mother.  Jillian Oetken J. 02/03/2014, 8:37 AM

## 2014-03-29 ENCOUNTER — Other Ambulatory Visit: Payer: Self-pay | Admitting: Obstetrics and Gynecology

## 2014-03-31 LAB — CYTOLOGY - PAP

## 2015-01-01 ENCOUNTER — Emergency Department (HOSPITAL_BASED_OUTPATIENT_CLINIC_OR_DEPARTMENT_OTHER)
Admission: EM | Admit: 2015-01-01 | Discharge: 2015-01-01 | Disposition: A | Discharge status: Home / Self Care | Payer: PRIVATE HEALTH INSURANCE | Attending: Emergency Medicine | Admitting: Emergency Medicine

## 2015-01-01 ENCOUNTER — Emergency Department (HOSPITAL_BASED_OUTPATIENT_CLINIC_OR_DEPARTMENT_OTHER): Payer: PRIVATE HEALTH INSURANCE

## 2015-01-01 ENCOUNTER — Encounter (HOSPITAL_BASED_OUTPATIENT_CLINIC_OR_DEPARTMENT_OTHER): Payer: Self-pay | Admitting: Emergency Medicine

## 2015-01-01 DIAGNOSIS — Z79899 Other long term (current) drug therapy: Secondary | ICD-10-CM | POA: Insufficient documentation

## 2015-01-01 DIAGNOSIS — J159 Unspecified bacterial pneumonia: Secondary | ICD-10-CM | POA: Insufficient documentation

## 2015-01-01 DIAGNOSIS — J189 Pneumonia, unspecified organism: Secondary | ICD-10-CM

## 2015-01-01 LAB — BASIC METABOLIC PANEL
Anion gap: 5 (ref 5–15)
BUN: 6 mg/dL (ref 6–20)
CALCIUM: 8.3 mg/dL — AB (ref 8.9–10.3)
CHLORIDE: 105 mmol/L (ref 101–111)
CO2: 28 mmol/L (ref 22–32)
CREATININE: 0.64 mg/dL (ref 0.44–1.00)
GFR calc non Af Amer: >60 mL/min (ref >60)
GLUCOSE: 118 mg/dL — AB (ref 65–99)
Potassium: 3.7 mmol/L (ref 3.5–5.1)
Sodium: 138 mmol/L (ref 135–145)

## 2015-01-01 LAB — CBC WITH DIFFERENTIAL/PLATELET
BASOS ABS: 0 10*3/uL (ref 0.0–0.1)
BASOS PCT: 0 %
Eosinophils Absolute: 0.2 10*3/uL (ref 0.0–0.7)
Eosinophils Relative: 2 %
HEMATOCRIT: 37.2 % (ref 36.0–46.0)
HEMOGLOBIN: 11.7 g/dL — AB (ref 12.0–15.0)
LYMPHS ABS: 2.7 10*3/uL (ref 0.7–4.0)
LYMPHS PCT: 25 %
MCH: 26.4 pg (ref 26.0–34.0)
MCHC: 31.5 g/dL (ref 30.0–36.0)
MCV: 84 fL (ref 78.0–100.0)
MONOS PCT: 4 %
Monocytes Absolute: 0.4 10*3/uL (ref 0.1–1.0)
NEUTROS ABS: 7.4 10*3/uL (ref 1.7–7.7)
Neutrophils Relative %: 69 %
Platelets: 252 10*3/uL (ref 150–400)
RBC: 4.43 MIL/uL (ref 3.87–5.11)
RDW: 12.3 % (ref 11.5–15.5)
WBC: 10.7 10*3/uL — ABNORMAL HIGH (ref 4.0–10.5)

## 2015-01-01 LAB — D-DIMER, QUANTITATIVE: D-Dimer, Quant: 0.45 ug/mL-FEU (ref 0.00–0.50)

## 2015-01-01 MED ORDER — BENZONATATE 100 MG PO CAPS: 100.0000 mg | ORAL_CAPSULE | Freq: Three times a day (TID) | ORAL | Status: DC

## 2015-01-01 MED ORDER — AZITHROMYCIN 250 MG PO TABS: 250.0000 mg | ORAL_TABLET | Freq: Every day | ORAL | Status: DC

## 2015-01-01 MED ORDER — ALBUTEROL SULFATE (2.5 MG/3ML) 0.083% IN NEBU
5.0000 mg | INHALATION_SOLUTION | Freq: Once | RESPIRATORY_TRACT | Status: AC
  Administered 2015-01-01: 5 mg via RESPIRATORY_TRACT
  Filled 2015-01-01: qty 6

## 2015-01-01 MED ORDER — ALBUTEROL SULFATE HFA 108 (90 BASE) MCG/ACT IN AERS
2.0000 | INHALATION_SPRAY | RESPIRATORY_TRACT | Status: DC | PRN
  Filled 2015-01-01: qty 6.7

## 2015-01-01 NOTE — ED Notes (Signed)
Patient instructed on albuterol use with spacer, no questions from patient. Pt understands use.

## 2015-01-01 NOTE — ED Provider Notes (Signed)
CSN: [id number]     Arrival date & time 01/01/15  1334 History  By signing my name below, I, Jillian Ruiz, attest that this documentation has been prepared under the direction and in the presence of Rolan Bucco, MD. Electronically Signed: Evon Slack, ED Scribe. 01/01/2015. 4:20 PM.      Chief Complaint  Patient presents with  . Hemoptysis    The history is provided by the patient. No language interpreter was used.    HPI Comments: Jillian Ruiz is a 24 y.o. female who presents to the Emergency Department complaining of worsening cough onset 1 week prior. Pt states that she has intermittent fever and myalgias. Pt states that her cough has recently worsened to a productive cough of blood tinged sputum, chocolate in color. She also reports having CP that's worse when breathing. Reports feeling slightly SOB at time and describes it as not being able to take a full breath at time. Pt denies vomiting or diarrhea. Pt does report that she is current on Mirena for birth control. Pt denies Hx of PE or DVT. Pt denies any recent long distance travel.     Past Medical History  Diagnosis Date  . Medical history non-contributory    Past Surgical History  Procedure Laterality Date  . Cesarean section    . Cesarean section N/A 02/01/2014    Procedure: REPEAT CESAREAN SECTION;  Surgeon: Dorien Chihuahua. Richardson Dopp, MD;  Location: [facility];  Service: Obstetrics;  Laterality: N/A;   History reviewed. No pertinent family history. Social History  Substance Use Topics  . Smoking status: Never Smoker   . Smokeless tobacco: Never Used  . Alcohol Use: No   OB History    Gravida Para Term Preterm AB TAB SAB Ectopic Multiple Living   0 1     Review of Systems  Constitutional: Positive for fever. Negative for chills, diaphoresis and fatigue.  HENT: Negative for congestion, rhinorrhea and sneezing.   Eyes: Negative.   Respiratory: Positive for cough, shortness of breath and wheezing. Negative for  chest tightness.   Cardiovascular: Positive for chest pain. Negative for leg swelling.  Gastrointestinal: Negative for nausea, vomiting, abdominal pain, diarrhea and blood in stool.  Genitourinary: Negative for frequency, hematuria, flank pain and difficulty urinating.  Musculoskeletal: Positive for myalgias. Negative for back pain and arthralgias.  Skin: Negative for rash.  Neurological: Negative for dizziness, speech difficulty, weakness, numbness and headaches.  All other systems reviewed and are negative.     Allergies  Review of patient's allergies indicates no known allergies.  Home Medications   Prior to Admission medications   Medication Sig Start Date End Date Taking? Authorizing Provider  azithromycin (ZITHROMAX) 250 MG tablet Take 1 tablet (250 mg total) by mouth daily. Take first 2 tablets together, then 1 every day until finished. 01/01/15   Rolan Bucco, MD  benzonatate (TESSALON) 100 MG capsule Take 1 capsule (100 mg total) by mouth every 8 (eight) hours. 01/01/15   Rolan Bucco, MD  Ca Carbonate-Mag Hydroxide (ROLAIDS PO) Take 1 tablet by mouth as needed (heartburn).    Historical Provider, MD  calcium carbonate (TUMS - DOSED IN MG ELEMENTAL CALCIUM) 500 MG chewable tablet Chew 2 tablets by mouth as needed for indigestion or heartburn.    Historical Provider, MD  ibuprofen (ADVIL,MOTRIN) 600 MG tablet Take 1 tablet (600 mg total) by mouth every 6 (six) hours as needed. 02/03/14   Gerald Leitz, MD  IRON PO  Take 1 tablet by mouth daily.    Historical Provider, MD  oxyCODONE-acetaminophen (PERCOCET/ROXICET) 5-325 MG per tablet Take 1-2 tablets by mouth every 6 (six) hours as needed (for pain scale less than 7). 02/03/14   Gerald Leitzara Cole, MD  Prenatal Vit-Fe Fumarate-FA (PRENATAL MULTIVITAMIN) TABS tablet Take 1 tablet by mouth daily at 12 noon.    Historical Provider, MD   BP 120/68 mmHg  Pulse 107  Temp(Src) 98.6 F (37 C) (Oral)  Resp 18  Ht 5' 4.5" (1.638 m)  Wt 249 lb  (112.946 kg)  BMI 42.10 kg/m2  SpO2 98%  LMP 12/13/2014   Physical Exam  Constitutional: She is oriented to person, place, and time. She appears well-developed and well-nourished.  HENT:  Head: Normocephalic and atraumatic.  Mouth/Throat: Oropharynx is clear and moist.  Eyes: Pupils are equal, round, and reactive to light.  Neck: Normal range of motion. Neck supple.  Cardiovascular: Normal rate, regular rhythm and normal heart sounds.   Pulmonary/Chest: Effort normal. No respiratory distress. She has wheezes (bilateral expiratory wheezes, no increased WOB). She has no rales. She exhibits no tenderness.  Abdominal: Soft. Bowel sounds are normal. There is no tenderness. There is no rebound and no guarding.  Musculoskeletal: Normal range of motion. She exhibits no edema.  No edema or calf tenderness  Lymphadenopathy:    She has no cervical adenopathy.  Neurological: She is alert and oriented to person, place, and time.  Skin: Skin is warm and dry. No rash noted.  Psychiatric: She has a normal mood and affect.    ED Course  Procedures (including critical care time) DIAGNOSTIC STUDIES: Oxygen Saturation is 99% on RA, normal by my interpretation.    COORDINATION OF CARE: 3:23 PM-Discussed treatment plan with pt at bedside and pt agreed to plan.     Labs Review Labs Reviewed  CBC WITH DIFFERENTIAL/PLATELET - Abnormal; Notable for the following:    WBC 10.7 (*)    Hemoglobin 11.7 (*)    All other components within normal limits  BASIC METABOLIC PANEL - Abnormal; Notable for the following:    Glucose, Bld 118 (*)    Calcium 8.3 (*)    All other components within normal limits  D-DIMER, QUANTITATIVE (NOT AT [facility])    Imaging Review Dg Chest 2 View  01/01/2015  CLINICAL DATA:  Cough and substernal chest pain for 1 week EXAM: CHEST - 2 VIEW COMPARISON:  None. FINDINGS: Cardiac shadow is within normal limits. The lungs are well aerated bilaterally. Minimal left basilar infiltrate  is seen projecting in the lower lobe. No acute bony abnormality is seen. IMPRESSION: Minimal left basilar infiltrate. Electronically Signed   By: Alcide CleverMark  Lukens M.D.   On: 01/01/2015 14:33   I have personally reviewed and evaluated these images and lab results as part of my medical decision-making.   EKG Interpretation None      MDM   Final diagnoses:  Community acquired pneumonia   Pt presents with cough and wheezing and slight hemoptysis.  She has no increased WOB. Her chest x-ray shows evidence of an infiltrate. She's otherwise well-appearing. She has some mild tachycardia but no tachypnea. She has normal oxygen saturations. Her d-dimer was normal with no other suggestions of pulmonary embolus. She was discharged in good condition. She has no ongoing hemoptysis. She was started on Zithromax and [employer] Perles. She was given albuterol neb in the ED and discharged with an inhaler.  She was advised to return if her symptoms worsen or  she has worsening hemoptysis.   I personally performed the services described in this documentation, which was scribed in my presence.  The recorded information has been reviewed and considered.      Rolan Bucco, MD 01/01/15 [phone]

## 2015-01-01 NOTE — ED Notes (Signed)
Presents with congested cough since last Saturday. States has had intermittent fever and sweating.

## 2015-01-01 NOTE — ED Notes (Signed)
Pt reports coughing up blood this am, wanted to be evaluated

## 2015-01-01 NOTE — Discharge Instructions (Signed)

## 2015-01-01 NOTE — ED Notes (Signed)
RT requested to assess pt also

## 2015-09-22 ENCOUNTER — Other Ambulatory Visit (HOSPITAL_COMMUNITY)
Admission: RE | Admit: 2015-09-22 | Discharge: 2015-09-22 | Disposition: A | Discharge status: Home / Self Care | Payer: BLUE CROSS/BLUE SHIELD | Source: Ambulatory Visit | Attending: Obstetrics and Gynecology | Admitting: Obstetrics and Gynecology

## 2015-09-22 ENCOUNTER — Other Ambulatory Visit: Payer: Self-pay | Admitting: Obstetrics and Gynecology

## 2015-09-22 DIAGNOSIS — Z113 Encounter for screening for infections with a predominantly sexual mode of transmission: Secondary | ICD-10-CM | POA: Diagnosis present

## 2015-09-22 DIAGNOSIS — Z01419 Encounter for gynecological examination (general) (routine) without abnormal findings: Secondary | ICD-10-CM | POA: Insufficient documentation

## 2015-09-23 LAB — CYTOLOGY - PAP

## 2016-12-18 ENCOUNTER — Emergency Department (HOSPITAL_BASED_OUTPATIENT_CLINIC_OR_DEPARTMENT_OTHER)
Admission: EM | Admit: 2016-12-18 | Discharge: 2016-12-18 | Disposition: A | Discharge status: Home / Self Care | Payer: No Typology Code available for payment source | Attending: Emergency Medicine | Admitting: Emergency Medicine

## 2016-12-18 ENCOUNTER — Encounter (HOSPITAL_BASED_OUTPATIENT_CLINIC_OR_DEPARTMENT_OTHER): Payer: Self-pay | Admitting: Emergency Medicine

## 2016-12-18 ENCOUNTER — Other Ambulatory Visit: Payer: Self-pay

## 2016-12-18 DIAGNOSIS — S81012A Laceration without foreign body, left knee, initial encounter: Secondary | ICD-10-CM | POA: Insufficient documentation

## 2016-12-18 DIAGNOSIS — Y998 Other external cause status: Secondary | ICD-10-CM | POA: Insufficient documentation

## 2016-12-18 DIAGNOSIS — M25562 Pain in left knee: Secondary | ICD-10-CM

## 2016-12-18 DIAGNOSIS — Y92410 Unspecified street and highway as the place of occurrence of the external cause: Secondary | ICD-10-CM | POA: Insufficient documentation

## 2016-12-18 DIAGNOSIS — Y9389 Activity, other specified: Secondary | ICD-10-CM | POA: Diagnosis not present

## 2016-12-18 MED ORDER — IBUPROFEN 800 MG PO TABS
800.0000 mg | ORAL_TABLET | Freq: Once | ORAL | Status: AC
  Administered 2016-12-18: 800 mg via ORAL
  Filled 2016-12-18: qty 1

## 2016-12-18 MED ORDER — ACETAMINOPHEN 500 MG PO TABS
1000.0000 mg | ORAL_TABLET | Freq: Once | ORAL | Status: AC
  Administered 2016-12-18: 1000 mg via ORAL
  Filled 2016-12-18: qty 2
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
## 2016-12-18 NOTE — Discharge Instructions (Signed)
Take 4 over the counter ibuprofen tablets 3 times a day or 2 over-the-counter naproxen tablets twice a day for pain. Also take tylenol 1000mg(2 extra strength) four times a day.    
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
## 2018-11-12 DIAGNOSIS — Z3201 Encounter for pregnancy test, result positive: Secondary | ICD-10-CM | POA: Diagnosis not present

## 2018-11-12 DIAGNOSIS — Z3480 Encounter for supervision of other normal pregnancy, unspecified trimester: Secondary | ICD-10-CM | POA: Diagnosis not present

## 2018-11-12 DIAGNOSIS — Z3A08 8 weeks gestation of pregnancy: Secondary | ICD-10-CM | POA: Diagnosis not present

## 2018-11-12 DIAGNOSIS — O26891 Other specified pregnancy related conditions, first trimester: Secondary | ICD-10-CM | POA: Diagnosis not present

## 2018-11-12 DIAGNOSIS — O26851 Spotting complicating pregnancy, first trimester: Secondary | ICD-10-CM | POA: Diagnosis not present

## 2018-11-12 DIAGNOSIS — Z3481 Encounter for supervision of other normal pregnancy, first trimester: Secondary | ICD-10-CM | POA: Diagnosis not present

## 2018-11-12 LAB — OB RESULTS CONSOLE GC/CHLAMYDIA
Chlamydia: NEGATIVE
Gonorrhea: NEGATIVE

## 2018-11-12 LAB — OB RESULTS CONSOLE HIV ANTIBODY (ROUTINE TESTING): HIV: NONREACTIVE

## 2018-11-12 LAB — OB RESULTS CONSOLE ANTIBODY SCREEN: Antibody Screen: NEGATIVE

## 2018-11-12 LAB — OB RESULTS CONSOLE ABO/RH: RH Type: POSITIVE

## 2018-11-12 LAB — OB RESULTS CONSOLE RUBELLA ANTIBODY, IGM: Rubella: IMMUNE

## 2018-11-12 LAB — OB RESULTS CONSOLE HEPATITIS B SURFACE ANTIGEN: Hepatitis B Surface Ag: NEGATIVE

## 2018-11-12 LAB — OB RESULTS CONSOLE RPR: RPR: NONREACTIVE

## 2018-11-26 ENCOUNTER — Other Ambulatory Visit (HOSPITAL_COMMUNITY)
Admission: RE | Admit: 2018-11-26 | Discharge: 2018-11-26 | Disposition: A | Discharge status: Home / Self Care | Payer: Medicaid Other | Source: Ambulatory Visit | Attending: Obstetrics and Gynecology | Admitting: Obstetrics and Gynecology

## 2018-11-26 ENCOUNTER — Other Ambulatory Visit: Payer: Self-pay | Admitting: Obstetrics and Gynecology

## 2018-11-26 DIAGNOSIS — Z3490 Encounter for supervision of normal pregnancy, unspecified, unspecified trimester: Secondary | ICD-10-CM | POA: Diagnosis not present

## 2018-11-27 DIAGNOSIS — O30041 Twin pregnancy, dichorionic/diamniotic, first trimester: Secondary | ICD-10-CM | POA: Diagnosis not present

## 2018-11-27 DIAGNOSIS — Z3A09 9 weeks gestation of pregnancy: Secondary | ICD-10-CM | POA: Diagnosis not present

## 2018-12-02 LAB — CYTOLOGY - PAP
Chlamydia: NEGATIVE
Comment: NEGATIVE
Comment: NEGATIVE
Comment: NORMAL
Diagnosis: NEGATIVE
High risk HPV: NEGATIVE
Neisseria Gonorrhea: NEGATIVE

## 2019-02-09 DIAGNOSIS — O30042 Twin pregnancy, dichorionic/diamniotic, second trimester: Secondary | ICD-10-CM | POA: Diagnosis not present

## 2019-02-09 DIAGNOSIS — Z3482 Encounter for supervision of other normal pregnancy, second trimester: Secondary | ICD-10-CM | POA: Diagnosis not present

## 2019-02-09 DIAGNOSIS — O365990 Maternal care for other known or suspected poor fetal growth, unspecified trimester, not applicable or unspecified: Secondary | ICD-10-CM | POA: Diagnosis not present

## 2019-02-09 DIAGNOSIS — O3090 Multiple gestation, unspecified, unspecified trimester: Secondary | ICD-10-CM | POA: Diagnosis not present

## 2019-02-09 DIAGNOSIS — Z36 Encounter for antenatal screening for chromosomal anomalies: Secondary | ICD-10-CM | POA: Diagnosis not present

## 2019-02-23 ENCOUNTER — Ambulatory Visit (HOSPITAL_COMMUNITY): Payer: PRIVATE HEALTH INSURANCE

## 2019-02-24 ENCOUNTER — Encounter (HOSPITAL_COMMUNITY): Payer: Self-pay | Admitting: *Deleted

## 2019-02-27 ENCOUNTER — Other Ambulatory Visit (HOSPITAL_COMMUNITY): Payer: Self-pay | Admitting: *Deleted

## 2019-02-27 ENCOUNTER — Ambulatory Visit (HOSPITAL_COMMUNITY)
Admission: RE | Admit: 2019-02-27 | Discharge: 2019-02-27 | Disposition: A | Discharge status: Home / Self Care | Payer: Medicaid Other | Source: Ambulatory Visit | Attending: Obstetrics and Gynecology | Admitting: Obstetrics and Gynecology

## 2019-02-27 ENCOUNTER — Other Ambulatory Visit: Payer: Self-pay

## 2019-02-27 ENCOUNTER — Encounter (HOSPITAL_COMMUNITY): Payer: Self-pay

## 2019-02-27 ENCOUNTER — Ambulatory Visit (HOSPITAL_BASED_OUTPATIENT_CLINIC_OR_DEPARTMENT_OTHER): Payer: Medicaid Other | Admitting: Obstetrics and Gynecology

## 2019-02-27 ENCOUNTER — Ambulatory Visit (HOSPITAL_COMMUNITY): Payer: Medicaid Other | Admitting: *Deleted

## 2019-02-27 VITALS — BP 115/85 | HR 102 | Temp 97.7°F

## 2019-02-27 DIAGNOSIS — O30042 Twin pregnancy, dichorionic/diamniotic, second trimester: Secondary | ICD-10-CM | POA: Insufficient documentation

## 2019-02-27 DIAGNOSIS — O99212 Obesity complicating pregnancy, second trimester: Secondary | ICD-10-CM

## 2019-02-27 DIAGNOSIS — Z3A22 22 weeks gestation of pregnancy: Secondary | ICD-10-CM

## 2019-02-27 DIAGNOSIS — O30049 Twin pregnancy, dichorionic/diamniotic, unspecified trimester: Secondary | ICD-10-CM

## 2019-02-27 NOTE — Progress Notes (Signed)
MFM Note  This patient was seen due to a spontaneously conceived twin pregnancy and maternal obesity.  She denies any significant past medical history and denies any problems in her current pregnancy.  The patient reports two prior full-term uncomplicated cesarean deliveries.  She is currently taking a daily prenatal vitamin and iron supplements.   The patient was seen in consultation today as there was a discordance noted in the fetal growth during an ultrasound performed in your office.  The views of the fetal anatomy were also unable to be fully visualized during that exam.  The patient reports that her [facility] was changed to July 01, 2019 based on a first trimester ultrasound performed in your office.  The patient has not had any screening tests for fetal aneuploidy drawn in her current pregnancy, as an aneuploid fetus would not change the outcome of her current pregnancy.  A thick dividing membrane was noted separating the two fetuses, indicating that these are dichorionic, diamniotic twins.  On today's exam,  the overall EFW for twin A measured about 1 week behind the [facility] of July 01, 2019.  The EFW for twin B measured about 4 days behind the [facility] of July 01, 2019.  Although there is an 8% discordance in the weights of the two fetuses, there is only a 2 ounce difference in the EFW's between twin A and twin B.  Doppler studies of the umbilical arteries performed for both twin A and twin B continue to show forward diastolic flow.  There were no signs of absent or reversed end-diastolic flow.  The views of the fetal anatomy's for both fetuses were limited due to the maternal body habitus and the fetal positions.    The proximal cardiac views were visualized for twin A.  There were no obvious cardiac anomalies noted in twin A.  The cardiac views were unable to be visualized and twin B.  The increased risk of preeclampsia, gestational diabetes, and preterm birth/labor associated with twin pregnancies  was discussed.  She was advised that she will continue to be followed closely to assess for these conditions. As pregnancies with multiple gestations are at increased risk for developing preeclampsia, she was advised to start taking a daily baby aspirin (81 mg per day) to decrease her risk of developing preeclampsia  as soon as possible.  The management of dichorionic twins was discussed.  She was advised that management of twin pregnancies will involve frequent ultrasound exams to assess the fetal growth and amniotic fluid level. We will continue to follow her with serial growth ultrasounds.    Should fetal growth restriction be confirmed during her future exams, we will continue to follow her with frequent umbilical artery Doppler studies and fetal testing.  She was advised that delivery for uncomplicated dichorionic twins is usually recommended at around 38 weeks.  However should growth restriction be noted in a twin gestation, an earlier delivery usually at around 68 to 37 weeks may be recommended.  A follow-up exam was scheduled in 3 weeks to assess the fetal growth and took obtain better views of the fetal anatomy.   At the end of the consultation, the patient stated that all of her questions have been answered to her complete satisfaction.  Thank you for referring this patient for a Maternal-Fetal Medicine consultation.    A total of 32 minutes was spent counseling and coordinating the care for this patient.  Greater than 50% of the time was spent in direct face-to-face contact.

## 2019-03-09 ENCOUNTER — Encounter (HOSPITAL_COMMUNITY): Payer: Self-pay | Admitting: Obstetrics and Gynecology

## 2019-03-20 ENCOUNTER — Encounter (HOSPITAL_COMMUNITY): Payer: Self-pay

## 2019-03-20 ENCOUNTER — Ambulatory Visit (HOSPITAL_COMMUNITY): Payer: Medicaid Other

## 2019-03-20 ENCOUNTER — Ambulatory Visit (HOSPITAL_COMMUNITY): Payer: Medicaid Other | Admitting: *Deleted

## 2019-03-20 ENCOUNTER — Other Ambulatory Visit: Payer: Self-pay

## 2019-03-20 ENCOUNTER — Other Ambulatory Visit (HOSPITAL_COMMUNITY): Payer: Self-pay | Admitting: Obstetrics

## 2019-03-20 ENCOUNTER — Ambulatory Visit (HOSPITAL_COMMUNITY)
Admission: RE | Admit: 2019-03-20 | Discharge: 2019-03-20 | Disposition: A | Discharge status: Home / Self Care | Payer: Medicaid Other | Source: Ambulatory Visit | Attending: Obstetrics and Gynecology | Admitting: Obstetrics and Gynecology

## 2019-03-20 ENCOUNTER — Other Ambulatory Visit (HOSPITAL_COMMUNITY): Payer: Self-pay | Admitting: *Deleted

## 2019-03-20 VITALS — BP 122/80 | HR 93 | Temp 97.6°F | Wt 276.0 lb

## 2019-03-20 DIAGNOSIS — Z362 Encounter for other antenatal screening follow-up: Secondary | ICD-10-CM

## 2019-03-20 DIAGNOSIS — O365990 Maternal care for other known or suspected poor fetal growth, unspecified trimester, not applicable or unspecified: Secondary | ICD-10-CM | POA: Diagnosis not present

## 2019-03-20 DIAGNOSIS — O30049 Twin pregnancy, dichorionic/diamniotic, unspecified trimester: Secondary | ICD-10-CM | POA: Diagnosis not present

## 2019-03-20 DIAGNOSIS — O30042 Twin pregnancy, dichorionic/diamniotic, second trimester: Secondary | ICD-10-CM | POA: Diagnosis not present

## 2019-03-20 DIAGNOSIS — Z3A25 25 weeks gestation of pregnancy: Secondary | ICD-10-CM

## 2019-03-27 ENCOUNTER — Other Ambulatory Visit: Payer: Self-pay

## 2019-03-27 ENCOUNTER — Ambulatory Visit (HOSPITAL_COMMUNITY)
Admission: RE | Admit: 2019-03-27 | Discharge: 2019-03-27 | Disposition: A | Discharge status: Home / Self Care | Payer: Medicaid Other | Source: Ambulatory Visit | Attending: Obstetrics and Gynecology | Admitting: Obstetrics and Gynecology

## 2019-03-27 ENCOUNTER — Ambulatory Visit (HOSPITAL_COMMUNITY): Payer: Medicaid Other | Admitting: *Deleted

## 2019-03-27 ENCOUNTER — Encounter (HOSPITAL_COMMUNITY): Payer: Self-pay

## 2019-03-27 VITALS — BP 118/75 | HR 102 | Temp 97.9°F

## 2019-03-27 DIAGNOSIS — O365921 Maternal care for other known or suspected poor fetal growth, second trimester, fetus 1: Secondary | ICD-10-CM | POA: Diagnosis not present

## 2019-03-27 DIAGNOSIS — O30049 Twin pregnancy, dichorionic/diamniotic, unspecified trimester: Secondary | ICD-10-CM

## 2019-03-27 DIAGNOSIS — O30042 Twin pregnancy, dichorionic/diamniotic, second trimester: Secondary | ICD-10-CM

## 2019-03-27 DIAGNOSIS — O34219 Maternal care for unspecified type scar from previous cesarean delivery: Secondary | ICD-10-CM

## 2019-03-27 DIAGNOSIS — O289 Unspecified abnormal findings on antenatal screening of mother: Secondary | ICD-10-CM

## 2019-03-27 DIAGNOSIS — Z3A26 26 weeks gestation of pregnancy: Secondary | ICD-10-CM | POA: Diagnosis not present

## 2019-03-27 DIAGNOSIS — Z363 Encounter for antenatal screening for malformations: Secondary | ICD-10-CM | POA: Diagnosis not present

## 2019-03-30 ENCOUNTER — Telehealth (HOSPITAL_COMMUNITY): Payer: Self-pay | Admitting: Genetic Counselor

## 2019-03-30 NOTE — Telephone Encounter (Signed)
I received a call from Ms. Jillian Ruiz inquiring if her noninvasive prenatal screening (NIPS) results were back from the laboratory [facility] yet. Ms. Jillian Ruiz voicemail had been full and she was worried she missed her results. I updated her that I still have not received her [facility] NIPS results back yet, but that I will call her as soon as I receive them. Ms. Jillian Ruiz was appreciative of the update and confirmed that she had no further questions.  Gershon Crane, MS, [facility] Genetic Counselor

## 2019-04-01 ENCOUNTER — Telehealth (HOSPITAL_COMMUNITY): Payer: Self-pay | Admitting: Genetic Counselor

## 2019-04-01 ENCOUNTER — Other Ambulatory Visit (HOSPITAL_COMMUNITY): Payer: Self-pay | Admitting: Obstetrics and Gynecology

## 2019-04-01 NOTE — Telephone Encounter (Signed)
LVM for Ms. [place] re: her low-risk [facility] noninvasive prenatal screening (NIPS) result. NIPS was offered because of growth restriction and shortened long bones of twin A noted on ultrasound. These negative results demonstrated an expected representation of chromosome 21, 18, 13 material for each twin, greatly reducing the likelihood of trisomies 21, 13, or 18.  NIPS analyzes placental (fetal) DNA in maternal circulation. NIPS is considered to be highly specific and sensitive, but is not considered to be a diagnostic test. We reviewed that this testing identifies 99% of twin pregnancies with trisomies 9, 78, and 57, but does not test for all genetic conditions that can be associated with fetal growth restriction or shortened long bones. Diagnostic testing via amniocentesis is available should she be interested in confirming this result. I encouraged Ms. [place] to contact me if she has any questions about her results at any time.  Gershon Crane, MS, [facility] Genetic Counselor

## 2019-04-03 ENCOUNTER — Encounter (HOSPITAL_COMMUNITY): Payer: Self-pay

## 2019-04-03 ENCOUNTER — Ambulatory Visit (HOSPITAL_COMMUNITY): Payer: Medicaid Other | Admitting: *Deleted

## 2019-04-03 ENCOUNTER — Ambulatory Visit (HOSPITAL_COMMUNITY)
Admission: RE | Admit: 2019-04-03 | Discharge: 2019-04-03 | Disposition: A | Discharge status: Home / Self Care | Payer: Medicaid Other | Source: Ambulatory Visit | Attending: Obstetrics and Gynecology | Admitting: Obstetrics and Gynecology

## 2019-04-03 ENCOUNTER — Other Ambulatory Visit: Payer: Self-pay

## 2019-04-03 VITALS — BP 115/63 | HR 103 | Temp 97.5°F

## 2019-04-03 DIAGNOSIS — Z3A27 27 weeks gestation of pregnancy: Secondary | ICD-10-CM | POA: Diagnosis not present

## 2019-04-03 DIAGNOSIS — O34219 Maternal care for unspecified type scar from previous cesarean delivery: Secondary | ICD-10-CM | POA: Diagnosis not present

## 2019-04-03 DIAGNOSIS — O365921 Maternal care for other known or suspected poor fetal growth, second trimester, fetus 1: Secondary | ICD-10-CM

## 2019-04-03 DIAGNOSIS — O30049 Twin pregnancy, dichorionic/diamniotic, unspecified trimester: Secondary | ICD-10-CM | POA: Insufficient documentation

## 2019-04-03 DIAGNOSIS — O30042 Twin pregnancy, dichorionic/diamniotic, second trimester: Secondary | ICD-10-CM | POA: Diagnosis not present

## 2019-04-03 DIAGNOSIS — O0990 Supervision of high risk pregnancy, unspecified, unspecified trimester: Secondary | ICD-10-CM | POA: Insufficient documentation

## 2019-04-03 DIAGNOSIS — O289 Unspecified abnormal findings on antenatal screening of mother: Secondary | ICD-10-CM | POA: Diagnosis not present

## 2019-04-10 ENCOUNTER — Other Ambulatory Visit: Payer: Self-pay

## 2019-04-10 ENCOUNTER — Encounter (HOSPITAL_COMMUNITY): Payer: Self-pay

## 2019-04-10 ENCOUNTER — Ambulatory Visit (HOSPITAL_COMMUNITY): Payer: Medicaid Other | Admitting: *Deleted

## 2019-04-10 ENCOUNTER — Ambulatory Visit (HOSPITAL_COMMUNITY): Payer: PRIVATE HEALTH INSURANCE

## 2019-04-10 ENCOUNTER — Encounter (HOSPITAL_COMMUNITY): Payer: Self-pay | Admitting: Obstetrics and Gynecology

## 2019-04-10 ENCOUNTER — Ambulatory Visit (HOSPITAL_COMMUNITY)
Admission: RE | Admit: 2019-04-10 | Discharge: 2019-04-10 | Disposition: A | Discharge status: Home / Self Care | Payer: Medicaid Other | Source: Ambulatory Visit | Attending: Obstetrics and Gynecology | Admitting: Obstetrics and Gynecology

## 2019-04-10 VITALS — BP 115/66 | HR 96 | Temp 97.3°F

## 2019-04-10 DIAGNOSIS — O289 Unspecified abnormal findings on antenatal screening of mother: Secondary | ICD-10-CM | POA: Diagnosis not present

## 2019-04-10 DIAGNOSIS — O99213 Obesity complicating pregnancy, third trimester: Secondary | ICD-10-CM | POA: Diagnosis not present

## 2019-04-10 DIAGNOSIS — O321XX2 Maternal care for breech presentation, fetus 2: Secondary | ICD-10-CM

## 2019-04-10 DIAGNOSIS — E669 Obesity, unspecified: Secondary | ICD-10-CM

## 2019-04-10 DIAGNOSIS — O30049 Twin pregnancy, dichorionic/diamniotic, unspecified trimester: Secondary | ICD-10-CM | POA: Diagnosis present

## 2019-04-10 DIAGNOSIS — O34219 Maternal care for unspecified type scar from previous cesarean delivery: Secondary | ICD-10-CM | POA: Diagnosis not present

## 2019-04-10 DIAGNOSIS — O359XX0 Maternal care for (suspected) fetal abnormality and damage, unspecified, not applicable or unspecified: Secondary | ICD-10-CM | POA: Diagnosis not present

## 2019-04-10 DIAGNOSIS — O365931 Maternal care for other known or suspected poor fetal growth, third trimester, fetus 1: Secondary | ICD-10-CM

## 2019-04-10 DIAGNOSIS — O30043 Twin pregnancy, dichorionic/diamniotic, third trimester: Secondary | ICD-10-CM

## 2019-04-10 DIAGNOSIS — Z3A28 28 weeks gestation of pregnancy: Secondary | ICD-10-CM | POA: Diagnosis not present

## 2019-04-10 DIAGNOSIS — O30042 Twin pregnancy, dichorionic/diamniotic, second trimester: Secondary | ICD-10-CM | POA: Diagnosis not present

## 2019-04-16 ENCOUNTER — Encounter (HOSPITAL_COMMUNITY): Payer: Self-pay

## 2019-04-16 ENCOUNTER — Ambulatory Visit (HOSPITAL_COMMUNITY)
Admission: RE | Admit: 2019-04-16 | Discharge: 2019-04-16 | Disposition: A | Discharge status: Home / Self Care | Payer: Medicaid Other | Source: Ambulatory Visit | Attending: Obstetrics and Gynecology | Admitting: Obstetrics and Gynecology

## 2019-04-16 ENCOUNTER — Other Ambulatory Visit: Payer: Self-pay

## 2019-04-16 ENCOUNTER — Ambulatory Visit (HOSPITAL_COMMUNITY): Payer: Medicaid Other | Admitting: *Deleted

## 2019-04-16 VITALS — BP 118/63 | HR 108 | Temp 97.8°F

## 2019-04-16 DIAGNOSIS — Z3A29 29 weeks gestation of pregnancy: Secondary | ICD-10-CM

## 2019-04-16 DIAGNOSIS — O30049 Twin pregnancy, dichorionic/diamniotic, unspecified trimester: Secondary | ICD-10-CM | POA: Diagnosis not present

## 2019-04-16 DIAGNOSIS — O99213 Obesity complicating pregnancy, third trimester: Secondary | ICD-10-CM

## 2019-04-16 DIAGNOSIS — O34219 Maternal care for unspecified type scar from previous cesarean delivery: Secondary | ICD-10-CM

## 2019-04-16 DIAGNOSIS — O30042 Twin pregnancy, dichorionic/diamniotic, second trimester: Secondary | ICD-10-CM | POA: Diagnosis not present

## 2019-04-16 DIAGNOSIS — O30043 Twin pregnancy, dichorionic/diamniotic, third trimester: Secondary | ICD-10-CM

## 2019-04-16 DIAGNOSIS — F418 Other specified anxiety disorders: Secondary | ICD-10-CM | POA: Diagnosis not present

## 2019-04-16 DIAGNOSIS — O289 Unspecified abnormal findings on antenatal screening of mother: Secondary | ICD-10-CM | POA: Diagnosis not present

## 2019-04-16 DIAGNOSIS — Z23 Encounter for immunization: Secondary | ICD-10-CM | POA: Diagnosis not present

## 2019-04-16 DIAGNOSIS — O365931 Maternal care for other known or suspected poor fetal growth, third trimester, fetus 1: Secondary | ICD-10-CM

## 2019-04-20 ENCOUNTER — Other Ambulatory Visit (HOSPITAL_COMMUNITY): Payer: Self-pay | Admitting: *Deleted

## 2019-04-20 DIAGNOSIS — O30043 Twin pregnancy, dichorionic/diamniotic, third trimester: Secondary | ICD-10-CM

## 2019-04-23 ENCOUNTER — Ambulatory Visit (HOSPITAL_COMMUNITY): Payer: Medicaid Other

## 2019-04-23 ENCOUNTER — Other Ambulatory Visit: Payer: Self-pay

## 2019-04-23 DIAGNOSIS — O30043 Twin pregnancy, dichorionic/diamniotic, third trimester: Secondary | ICD-10-CM | POA: Diagnosis present

## 2019-04-23 DIAGNOSIS — O30049 Twin pregnancy, dichorionic/diamniotic, unspecified trimester: Secondary | ICD-10-CM | POA: Diagnosis present

## 2019-04-23 DIAGNOSIS — O365931 Maternal care for other known or suspected poor fetal growth, third trimester, fetus 1: Secondary | ICD-10-CM | POA: Diagnosis not present

## 2019-04-23 MED ORDER — BETAMETHASONE SOD PHOS & ACET 6 (3-3) MG/ML IJ SUSP
12.0000 mg | Freq: Once | INTRAMUSCULAR | Status: AC
  Administered 2019-04-23: 12 mg via INTRAMUSCULAR

## 2019-04-24 ENCOUNTER — Ambulatory Visit (HOSPITAL_COMMUNITY)
Admission: RE | Admit: 2019-04-24 | Discharge: 2019-04-24 | Disposition: A | Discharge status: Home / Self Care | Payer: Medicaid Other | Source: Ambulatory Visit | Attending: Obstetrics and Gynecology | Admitting: Obstetrics and Gynecology

## 2019-04-24 ENCOUNTER — Encounter (HOSPITAL_COMMUNITY): Payer: Self-pay

## 2019-04-24 ENCOUNTER — Ambulatory Visit (HOSPITAL_COMMUNITY): Payer: Medicaid Other | Admitting: *Deleted

## 2019-04-24 ENCOUNTER — Ambulatory Visit (HOSPITAL_COMMUNITY): Payer: Medicaid Other

## 2019-04-24 VITALS — BP 115/74 | HR 85 | Temp 98.7°F

## 2019-04-24 VITALS — BP 115/74 | Temp 98.7°F

## 2019-04-24 DIAGNOSIS — O365931 Maternal care for other known or suspected poor fetal growth, third trimester, fetus 1: Secondary | ICD-10-CM

## 2019-04-24 DIAGNOSIS — O99213 Obesity complicating pregnancy, third trimester: Secondary | ICD-10-CM | POA: Diagnosis not present

## 2019-04-24 DIAGNOSIS — O289 Unspecified abnormal findings on antenatal screening of mother: Secondary | ICD-10-CM

## 2019-04-24 DIAGNOSIS — O30043 Twin pregnancy, dichorionic/diamniotic, third trimester: Secondary | ICD-10-CM

## 2019-04-24 DIAGNOSIS — O30049 Twin pregnancy, dichorionic/diamniotic, unspecified trimester: Secondary | ICD-10-CM

## 2019-04-24 DIAGNOSIS — O34219 Maternal care for unspecified type scar from previous cesarean delivery: Secondary | ICD-10-CM

## 2019-04-24 DIAGNOSIS — Z3A30 30 weeks gestation of pregnancy: Secondary | ICD-10-CM | POA: Diagnosis not present

## 2019-04-24 MED ORDER — BETAMETHASONE SOD PHOS & ACET 6 (3-3) MG/ML IJ SUSP
12.0000 mg | Freq: Once | INTRAMUSCULAR | Status: AC
  Administered 2019-04-24: 15:00:00 12 mg via INTRAMUSCULAR

## 2019-04-24 NOTE — Procedures (Signed)
Jillian Ruiz [birth year] [redacted]w[redacted]d  Fetus A Non-Stress Test Interpretation for 04/24/19  Indication: IUGR Jillian Ruiz 1990-10-10 [redacted]w[redacted]d   Fetus B Non-Stress Test Interpretation for 04/24/19  Indication: IUGR  Fetal Heart Rate Fetus B Mode: External Baseline Rate (B): 125 BPM Variability: Moderate Accelerations: 15 x 15 Decelerations: None  Uterine Activity Mode: Toco Contraction Frequency (min): none noted  Interpretation (Baby B - Fetal Testing) Nonstress Test Interpretation (Baby B): Reactive Comments (Baby B): FHR tracing rev'd by Dr. Judeth Cornfield    Fetal Heart Rate A Mode: External Baseline Rate (A): 140 bpm Variability: Moderate Accelerations: 15 x 15 Decelerations: None Multiple birth?: Yes  Uterine Activity Mode: Toco Contraction Frequency (min): none noted  Interpretation (Fetal Testing) Nonstress Test Interpretation: Reactive Comments: FHR tracing rev'd by Dr. Judeth Cornfield

## 2019-05-01 ENCOUNTER — Ambulatory Visit (HOSPITAL_COMMUNITY)
Admission: RE | Admit: 2019-05-01 | Discharge: 2019-05-01 | Disposition: A | Discharge status: Home / Self Care | Payer: Medicaid Other | Source: Ambulatory Visit | Attending: Obstetrics and Gynecology | Admitting: Obstetrics and Gynecology

## 2019-05-01 ENCOUNTER — Encounter (HOSPITAL_COMMUNITY): Payer: Self-pay

## 2019-05-01 ENCOUNTER — Ambulatory Visit (HOSPITAL_COMMUNITY): Payer: Medicaid Other | Admitting: *Deleted

## 2019-05-01 ENCOUNTER — Other Ambulatory Visit: Payer: Self-pay

## 2019-05-01 VITALS — BP 106/60 | Temp 98.6°F

## 2019-05-01 DIAGNOSIS — O365931 Maternal care for other known or suspected poor fetal growth, third trimester, fetus 1: Secondary | ICD-10-CM

## 2019-05-01 DIAGNOSIS — Z3A31 31 weeks gestation of pregnancy: Secondary | ICD-10-CM | POA: Diagnosis not present

## 2019-05-01 DIAGNOSIS — O30043 Twin pregnancy, dichorionic/diamniotic, third trimester: Secondary | ICD-10-CM | POA: Diagnosis not present

## 2019-05-01 NOTE — Procedures (Signed)
Jillian Ruiz March 17, 1990 [redacted]w[redacted]d  Fetus A Non-Stress Test Interpretation for 05/01/19  Indication: IUGR  Fetal Heart Rate A Mode: External Baseline Rate (A): 140 bpm Variability: Moderate Accelerations: 10 x 10 Decelerations: None Multiple birth?: Yes     Interpretation (Fetal Testing) Nonstress Test Interpretation: Reactive Comments: FHR tracing rev'd by Dr. Judeth Cornfield

## 2019-05-05 ENCOUNTER — Encounter (HOSPITAL_COMMUNITY): Payer: Self-pay | Admitting: *Deleted

## 2019-05-05 ENCOUNTER — Ambulatory Visit (HOSPITAL_COMMUNITY)
Admission: RE | Admit: 2019-05-05 | Discharge: 2019-05-05 | Disposition: A | Discharge status: Home / Self Care | Payer: Medicaid Other | Source: Ambulatory Visit | Attending: Obstetrics and Gynecology | Admitting: Obstetrics and Gynecology

## 2019-05-05 ENCOUNTER — Ambulatory Visit (HOSPITAL_COMMUNITY): Payer: Medicaid Other | Admitting: *Deleted

## 2019-05-05 ENCOUNTER — Other Ambulatory Visit: Payer: Self-pay

## 2019-05-05 VITALS — BP 114/85 | HR 97 | Temp 98.1°F

## 2019-05-05 DIAGNOSIS — Z362 Encounter for other antenatal screening follow-up: Secondary | ICD-10-CM

## 2019-05-05 DIAGNOSIS — O365931 Maternal care for other known or suspected poor fetal growth, third trimester, fetus 1: Secondary | ICD-10-CM

## 2019-05-05 DIAGNOSIS — Z3A31 31 weeks gestation of pregnancy: Secondary | ICD-10-CM | POA: Diagnosis not present

## 2019-05-05 DIAGNOSIS — O289 Unspecified abnormal findings on antenatal screening of mother: Secondary | ICD-10-CM | POA: Diagnosis not present

## 2019-05-05 DIAGNOSIS — O30043 Twin pregnancy, dichorionic/diamniotic, third trimester: Secondary | ICD-10-CM

## 2019-05-05 DIAGNOSIS — O34219 Maternal care for unspecified type scar from previous cesarean delivery: Secondary | ICD-10-CM | POA: Diagnosis not present

## 2019-05-05 DIAGNOSIS — O99213 Obesity complicating pregnancy, third trimester: Secondary | ICD-10-CM | POA: Diagnosis not present

## 2019-05-06 ENCOUNTER — Other Ambulatory Visit (HOSPITAL_COMMUNITY): Payer: Self-pay | Admitting: *Deleted

## 2019-05-06 DIAGNOSIS — O30049 Twin pregnancy, dichorionic/diamniotic, unspecified trimester: Secondary | ICD-10-CM

## 2019-05-08 ENCOUNTER — Other Ambulatory Visit (HOSPITAL_COMMUNITY): Payer: Self-pay | Admitting: Obstetrics and Gynecology

## 2019-05-08 ENCOUNTER — Ambulatory Visit (HOSPITAL_COMMUNITY): Payer: Medicaid Other | Admitting: *Deleted

## 2019-05-08 ENCOUNTER — Ambulatory Visit (HOSPITAL_COMMUNITY)
Admission: RE | Admit: 2019-05-08 | Discharge: 2019-05-08 | Disposition: A | Discharge status: Home / Self Care | Payer: Medicaid Other | Source: Ambulatory Visit | Attending: Obstetrics and Gynecology | Admitting: Obstetrics and Gynecology

## 2019-05-08 ENCOUNTER — Encounter (HOSPITAL_COMMUNITY): Payer: Self-pay

## 2019-05-08 ENCOUNTER — Other Ambulatory Visit: Payer: Self-pay

## 2019-05-08 VITALS — BP 113/81 | HR 110 | Temp 97.9°F

## 2019-05-08 DIAGNOSIS — O99213 Obesity complicating pregnancy, third trimester: Secondary | ICD-10-CM

## 2019-05-08 DIAGNOSIS — O30043 Twin pregnancy, dichorionic/diamniotic, third trimester: Secondary | ICD-10-CM

## 2019-05-08 DIAGNOSIS — Z3A32 32 weeks gestation of pregnancy: Secondary | ICD-10-CM

## 2019-05-08 DIAGNOSIS — Z362 Encounter for other antenatal screening follow-up: Secondary | ICD-10-CM

## 2019-05-08 DIAGNOSIS — O34219 Maternal care for unspecified type scar from previous cesarean delivery: Secondary | ICD-10-CM

## 2019-05-08 DIAGNOSIS — O289 Unspecified abnormal findings on antenatal screening of mother: Secondary | ICD-10-CM

## 2019-05-08 DIAGNOSIS — O365931 Maternal care for other known or suspected poor fetal growth, third trimester, fetus 1: Secondary | ICD-10-CM

## 2019-05-08 DIAGNOSIS — O30049 Twin pregnancy, dichorionic/diamniotic, unspecified trimester: Secondary | ICD-10-CM | POA: Diagnosis present

## 2019-05-08 NOTE — Procedures (Signed)
Jillian Ruiz 1990/09/19 [redacted]w[redacted]d  Fetus A Non-Stress Test Interpretation for 05/08/19  Indication: IUGR Jillian Ruiz Apr 19, 1990 [redacted]w[redacted]d   Fetus B Non-Stress Test Interpretation for 05/08/19  Indication: IUGR  Fetal Heart Rate Fetus B Mode: External Baseline Rate (B): 145 BPM Variability: Moderate Accelerations: 15 x 15 Decelerations: None  Uterine Activity Mode: Toco Contraction Frequency (min): none noted  Interpretation (Baby B - Fetal Testing) Nonstress Test Interpretation (Baby B): Reactive Comments (Baby B): FHR tracing rev'd by Dr. Judeth Cornfield    Fetal Heart Rate A Mode: External Baseline Rate (A): 145 bpm Variability: Moderate Accelerations: 15 x 15 Decelerations: None Multiple birth?: Yes  Uterine Activity Mode: Toco Contraction Frequency (min): none noted  Interpretation (Fetal Testing) Nonstress Test Interpretation: Reactive Comments: FHR tracing rev'd by Dr. Judeth Cornfield

## 2019-05-12 DIAGNOSIS — Z3490 Encounter for supervision of normal pregnancy, unspecified, unspecified trimester: Secondary | ICD-10-CM | POA: Diagnosis not present

## 2019-05-13 ENCOUNTER — Other Ambulatory Visit: Payer: Self-pay

## 2019-05-13 ENCOUNTER — Ambulatory Visit (HOSPITAL_COMMUNITY): Payer: Medicaid Other | Admitting: *Deleted

## 2019-05-13 ENCOUNTER — Other Ambulatory Visit (HOSPITAL_COMMUNITY): Payer: Self-pay | Admitting: Obstetrics and Gynecology

## 2019-05-13 ENCOUNTER — Ambulatory Visit (HOSPITAL_BASED_OUTPATIENT_CLINIC_OR_DEPARTMENT_OTHER): Payer: Medicaid Other | Admitting: Obstetrics and Gynecology

## 2019-05-13 ENCOUNTER — Encounter (HOSPITAL_COMMUNITY): Payer: Self-pay

## 2019-05-13 ENCOUNTER — Ambulatory Visit (HOSPITAL_COMMUNITY)
Admission: RE | Admit: 2019-05-13 | Discharge: 2019-05-13 | Disposition: A | Discharge status: Home / Self Care | Payer: Medicaid Other | Source: Ambulatory Visit | Attending: Obstetrics and Gynecology | Admitting: Obstetrics and Gynecology

## 2019-05-13 VITALS — BP 106/65 | HR 98 | Temp 98.6°F

## 2019-05-13 DIAGNOSIS — O30049 Twin pregnancy, dichorionic/diamniotic, unspecified trimester: Secondary | ICD-10-CM | POA: Insufficient documentation

## 2019-05-13 DIAGNOSIS — O289 Unspecified abnormal findings on antenatal screening of mother: Secondary | ICD-10-CM | POA: Diagnosis not present

## 2019-05-13 DIAGNOSIS — Z3A33 33 weeks gestation of pregnancy: Secondary | ICD-10-CM | POA: Diagnosis not present

## 2019-05-13 DIAGNOSIS — O365931 Maternal care for other known or suspected poor fetal growth, third trimester, fetus 1: Secondary | ICD-10-CM | POA: Diagnosis not present

## 2019-05-13 DIAGNOSIS — O30043 Twin pregnancy, dichorionic/diamniotic, third trimester: Secondary | ICD-10-CM

## 2019-05-13 NOTE — Procedures (Signed)
Jillian Ruiz 13-Jul-1990 [redacted]w[redacted]d  Fetus A Non-Stress Test Interpretation for 05/13/19  Indication: IUGR  Fetal Heart Rate A Mode: External Baseline Rate (A): 135 bpm Variability: Moderate Accelerations: 15 x 15 Decelerations: None Multiple birth?: Yes Jillian Ruiz 1990-10-06 [redacted]w[redacted]d   Fetus B Non-Stress Test Interpretation for 05/13/19  Indication: Di/di twin pregnancy  Fetal Heart Rate Fetus B Mode: External Baseline Rate (B): 145 BPM Variability: Moderate Accelerations: 15 x 15 Decelerations: None     Interpretation (Baby B - Fetal Testing) Nonstress Test Interpretation (Baby B): Reactive Comments (Baby B): Reviewed tracing with Dr. Judeth Cornfield      Interpretation (Fetal Testing) Nonstress Test Interpretation: Reactive Comments: Reviewed tracing with Dr. Judeth Cornfield

## 2019-05-15 ENCOUNTER — Encounter (HOSPITAL_COMMUNITY): Payer: Self-pay

## 2019-05-15 NOTE — Patient Instructions (Signed)
Michaela Broski  05/15/2019   Your procedure is scheduled on:  05/20/2019  Arrive at 2:00PM at Entrance C on [address] at [facility]  and [employer]. You are invited to use the FREE valet parking or use the Visitor's parking deck.  Pick up the phone at the desk and dial [phone].  Call this number if you have problems the morning of surgery: [phone]  Remember:   Do not eat food:(After Midnight) Desps de medianoche.  Do not drink clear liquids: (6 Hours before arrival) 6 horas ante llegada.  Take these medicines the morning of surgery with A SIP OF WATER:  none   Do not wear jewelry, make-up or nail polish.  Do not wear lotions, powders, or perfumes. Do not wear deodorant.  Do not shave 48 hours prior to surgery.  Do not bring valuables to the hospital.  [facility] is not   responsible for any belongings or valuables brought to the hospital.  Contacts, dentures or bridgework may not be worn into surgery.  Leave suitcase in the car. After surgery it may be brought to your room.  For patients admitted to the hospital, checkout time is 11:00 AM the day of              discharge.      Please read over the following fact sheets that you were given:     Preparing for Surgery

## 2019-05-16 ENCOUNTER — Inpatient Hospital Stay (HOSPITAL_COMMUNITY)
Admission: AD | Admit: 2019-05-16 | Discharge: 2019-05-16 | Disposition: A | Discharge status: Home / Self Care | Payer: Medicaid Other | Attending: Obstetrics and Gynecology | Admitting: Obstetrics and Gynecology

## 2019-05-16 ENCOUNTER — Other Ambulatory Visit: Payer: Self-pay | Admitting: Obstetrics and Gynecology

## 2019-05-16 ENCOUNTER — Encounter (HOSPITAL_COMMUNITY): Payer: Self-pay | Admitting: Obstetrics and Gynecology

## 2019-05-16 ENCOUNTER — Other Ambulatory Visit: Payer: Self-pay

## 2019-05-16 DIAGNOSIS — Z3689 Encounter for other specified antenatal screening: Secondary | ICD-10-CM

## 2019-05-16 DIAGNOSIS — Z3A33 33 weeks gestation of pregnancy: Secondary | ICD-10-CM | POA: Diagnosis not present

## 2019-05-16 DIAGNOSIS — Z7982 Long term (current) use of aspirin: Secondary | ICD-10-CM | POA: Diagnosis not present

## 2019-05-16 DIAGNOSIS — O30043 Twin pregnancy, dichorionic/diamniotic, third trimester: Secondary | ICD-10-CM | POA: Diagnosis not present

## 2019-05-16 DIAGNOSIS — Z79899 Other long term (current) drug therapy: Secondary | ICD-10-CM | POA: Insufficient documentation

## 2019-05-16 DIAGNOSIS — O365931 Maternal care for other known or suspected poor fetal growth, third trimester, fetus 1: Secondary | ICD-10-CM | POA: Diagnosis not present

## 2019-05-16 NOTE — Discharge Instructions (Signed)
National Guideline Alliance ([place]).Twin and Triplet Pregnancy. [place]: National Institute for Health and Care Excellence (UK); 2019.">  Multiple Pregnancy Multiple pregnancy means that a woman is carrying more than one baby at a time. She may be pregnant with twins, triplets, or more. The majority of multiple pregnancies are twins. Naturally conceiving triplets or more (higher-order multiples) is rare. Multiple pregnancies are riskier than single pregnancies. A woman with a multiple pregnancy is more likely to have certain problems during her pregnancy. How does a multiple pregnancy happen? A multiple pregnancy happens when:  The woman's body releases more than one egg at a time, and then each egg gets fertilized by a different sperm. ? This is the most common type of multiple pregnancy. ? Twins or other multiples produced this way are called fraternal. They are no more alike than non-multiple siblings are.  One sperm fertilizes one egg, which then divides into more than one embryo. ? Twins or other multiples produced this way are called identical. Identical multiples are always the same gender, and they look very much alike. Who is most likely to have a multiple pregnancy? A multiple pregnancy is more likely to develop in women who:  Have had fertility treatment, especially if the treatment included fertility medicines.  Are older than 31 years of age.  Have already had four or more children.  Have a family history of multiple pregnancy. How is a multiple pregnancy diagnosed? A multiple pregnancy may be diagnosed based on:  Symptoms such as: ? Rapid weight gain in the first 3 months of pregnancy (first trimester). ? More severe nausea and breast tenderness than what is typical of a single pregnancy. ? A larger uterus than what is normal for the stage of the pregnancy.  Blood tests that detect a higher-than-normal level of human chorionic gonadotropin (hCG). This is a hormone that your  body produces in early pregnancy.  An ultrasound exam. This is used to confirm that you are carrying multiples. What risks come with multiple pregnancy? A multiple pregnancy puts you at a higher risk for certain problems during or after your pregnancy. These include:  Delivering your babies before your due date (preterm birth). A full-term pregnancy lasts for at least 37 weeks. ? Babies born before 37 weeks may have a higher risk for breathing problems, feeding difficulties, cerebral palsy, and learning disabilities.  Diabetes.  Preeclampsia. This is a serious condition that causes high blood pressure and headaches during pregnancy.  Too much blood loss after childbirth (postpartum hemorrhage).  Postpartum depression.  Low birth weight of the babies. How will having a multiple pregnancy affect my care? Your health care team will monitor you more closely. You may need more frequent prenatal visits. This will ensure that you are healthy and that your babies are growing normally. Follow these instructions at home: Eating and drinking  Increase your nutrition. ? Follow your health care provider's recommendations for weight gain. You may need to gain a little extra weight when you are pregnant with multiples. ? Eat healthy snacks often throughout the day. This will add calories and reduce nausea.  Drink enough fluid to keep your urine pale yellow.  Take prenatal vitamins. Ask your health care provider what vitamins are right for you. Activity Limit your activities by 20-24 weeks of pregnancy.  Rest often.  Avoid activities, exercise, and work that take a lot of effort.  Ask your health care provider when you should stop having sex. General instructions  Do not use any   products that contain nicotine or tobacco, such as cigarettes, e-cigarettes, and chewing tobacco. If you need help quitting, ask your health care provider.  Do not drink alcohol or use illegal drugs.  Take  over-the-counter and prescription medicines only as told by your health care provider.  Arrange for extra help around the house.  Keep all follow-up visits and all prenatal visits as told by your health care provider. This is important. Where to find more information  American College of Obstetricians and Gynecology: www.acog.org Contact a health care provider if:  You have dizziness.  You have nausea, vomiting, or diarrhea that does not go away.  You have depression or other emotions that are interfering with your normal activities.  You have a fever.  You have pain with urination.  You have a bad-smelling vaginal discharge.  You notice increased swelling in your face, hands, legs, or ankles. Get help right away if:  You have fluid leaking from your vagina.  You have bleeding from your vagina.  You have pelvic cramps, pelvic pressure, or nagging pain in your abdomen or lower back.  You are having regular contractions.  You have a severe headache, with or without changes in how you see.  You have chest pain or shortness of breath.  You notice that your babies move less often, or do not move at all. Summary  Having a multiple pregnancy means that a woman is carrying more than one baby at a time.  A multiple pregnancy puts you at a higher risk for delivering your babies before your due date, having diabetes, preeclampsia, too much blood loss after childbirth, or low birth weight of the babies.  Your health care provider will monitor you more closely during your pregnancy.  You may need to make some lifestyle changes during pregnancy. This includes eating more, limiting your activities after 20-24 weeks of pregnancy, and arranging for extra help around the house.  Follow up with your health care provider as instructed if you experience any complications. This information is not intended to replace advice given to you by your health care provider. Make sure you discuss  any questions you have with your health care provider. Document Revised: 08/25/2018 Document Reviewed: 08/25/2018 Elsevier Patient Education  2020 Elsevier Inc.  

## 2019-05-16 NOTE — MAU Note (Signed)
Pt reports to mau for NST.  Pt denies any pain, ctx, or LOF.  Reports good fetal movement.

## 2019-05-16 NOTE — H&P (Unsigned)
Jillian Ruiz is a 29 y.o. female presenting for ***. OB History    Gravida  3   Para  2   Term  2   Preterm      AB      Living  2     SAB      TAB      Ectopic      Multiple  0   Live Births  1          Past Medical History:  Diagnosis Date  . HSV (herpes simplex virus) anogenital infection   . Medical history non-contributory    Past Surgical History:  Procedure Laterality Date  . CESAREAN SECTION    . CESAREAN SECTION N/A 02/01/2014   Procedure: REPEAT CESAREAN SECTION;  Surgeon: Dorien Chihuahua. Richardson Dopp, MD;  Location: [facility];  Service: Obstetrics;  Laterality: N/A;   Family History: family history includes Depression in her mother. Social History:  reports that she has never smoked. She has never used smokeless tobacco. She reports that she does not drink alcohol or use drugs.     Maternal Diabetes: {Maternal Diabetes:3043596} Genetic Screening: {Genetic Screening:20205} Maternal Ultrasounds/Referrals: {Maternal Ultrasounds / Referrals:20211} Fetal Ultrasounds or other Referrals:  {Fetal Ultrasounds or Other Referrals:20213} Maternal Substance Abuse:  {Maternal Substance Abuse:20223} Significant Maternal Medications:  {Significant Maternal Meds:20233} Significant Maternal Lab Results:  {Significant Maternal Lab Results:20235} Other Comments:  {Other Comments:20251}  Review of Systems History   Last menstrual period 09/15/2018, unknown if currently breastfeeding. Exam Physical Exam  Prenatal labs: ABO, Rh: B/Positive/-- (10/28 0000) Antibody: Negative (10/28 0000) Rubella: Immune (10/28 0000) RPR: Nonreactive (10/28 0000)  HBsAg: Negative (10/28 0000)  HIV: Non-reactive (10/28 0000)  GBS:     Assessment/Plan: ***   Gerald Leitz 05/16/2019, 11:56 AM

## 2019-05-16 NOTE — MAU Provider Note (Signed)
History     CSN: [id number]  Arrival date and time: 05/16/19 0851   None     Chief Complaint  Patient presents with  . Non-stress Test   HPI  Patient Jillian Ruiz is a 29 y.o.  G3P2002 at [redacted]w[redacted]d here for NST. She has Di-DI twins with IUGR in Baby A. She gets regular NST/growth scans at [facility]. She was supposed to have OB NST this week but because of office moving locations, she is coming to MAU for an NST today.   She denies decreased fetal movements, LOF, vaginal bleeding, dysuria, contractions, fever, SOB. She denies any blood pressure or blood sugar problems in this pregnancy.   OB History    Gravida  3   Para  2   Term  2   Preterm      AB      Living  2     SAB      TAB      Ectopic      Multiple  0   Live Births  1           Past Medical History:  Diagnosis Date  . HSV (herpes simplex virus) anogenital infection   . Medical history non-contributory     Past Surgical History:  Procedure Laterality Date  . CESAREAN SECTION    . CESAREAN SECTION N/A 02/01/2014   Procedure: REPEAT CESAREAN SECTION;  Surgeon: Dorien Chihuahua. Richardson Dopp, MD;  Location: [facility];  Service: Obstetrics;  Laterality: N/A;    Family History  Problem Relation Age of Onset  . Depression Mother     Social History   Tobacco Use  . Smoking status: Never Smoker  . Smokeless tobacco: Never Used  Substance Use Topics  . Alcohol use: No  . Drug use: No    Allergies: No Known Allergies  Medications Prior to Admission  Medication Sig Dispense Refill Last Dose  . Acetaminophen (TYLENOL PO) Take by mouth.     Marland Kitchen aspirin EC 81 MG tablet Take 81 mg by mouth daily.     . Doxylamine Succinate, Sleep, (UNISOM PO) Take by mouth.     . Esomeprazole Magnesium (NEXIUM PO) Take by mouth.     . Ferrous Sulfate (IRON PO) Take by mouth.     Marland Kitchen ibuprofen (ADVIL,MOTRIN) 600 MG tablet Take 1 tablet (600 mg total) by mouth every 6 (six) hours as needed. (Patient not taking: Reported on  02/27/2019) 30 tablet 1   . Loratadine (CLARITIN PO) Take by mouth.     . Prenatal Vit-Fe Fumarate-FA (PRENATAL VITAMIN PO) Take by mouth.       Review of Systems  Constitutional: Negative.   HENT: Negative.   Gastrointestinal: Negative.   Genitourinary: Negative.   Musculoskeletal: Negative.   Neurological: Negative.    Physical Exam   Blood pressure 114/76, pulse 97, temperature 98.6 F (37 C), temperature source Oral, resp. rate 16, last menstrual period 09/15/2018, unknown if currently breastfeeding.  Physical Exam  Constitutional: She is oriented to person, place, and time. She appears well-developed.  HENT:  Head: Normocephalic.  GI: Soft.  Genitourinary:    Vagina normal.   Musculoskeletal:        General: Normal range of motion.     Cervical back: Normal range of motion.  Neurological: She is alert and oriented to person, place, and time.  Skin: Skin is warm and dry.    MAU Course  Procedures  MDM NST: Baby A: Baby A 145,  mod var, present acel, neg decels, uterine irratability Baby B: Baby B 135 , mod var, present acel, neg decels, uterine irritability.   Assessment and Plan   1. NST (non-stress test) reactive    2. Patient stable for discharge with strict third trimester/multi-fetal gestation precautions.  3. Return on Monday COVID 19 test and plan for operative delivery on Wednesday, May 5.  4. All questions answered.     Jillian Ruiz 05/16/2019, 9:53 AM

## 2019-05-18 ENCOUNTER — Other Ambulatory Visit: Payer: Self-pay

## 2019-05-18 ENCOUNTER — Other Ambulatory Visit (HOSPITAL_COMMUNITY)
Admission: RE | Admit: 2019-05-18 | Discharge: 2019-05-18 | Disposition: A | Discharge status: Home / Self Care | Payer: Medicaid Other | Source: Ambulatory Visit | Attending: Obstetrics and Gynecology | Admitting: Obstetrics and Gynecology

## 2019-05-18 DIAGNOSIS — Z01812 Encounter for preprocedural laboratory examination: Secondary | ICD-10-CM | POA: Insufficient documentation

## 2019-05-18 DIAGNOSIS — Z20822 Contact with and (suspected) exposure to covid-19: Secondary | ICD-10-CM | POA: Diagnosis not present

## 2019-05-18 HISTORY — DX: Anogenital herpesviral infection, unspecified: A60.9

## 2019-05-18 LAB — ABO/RH: ABO/RH(D): B POS

## 2019-05-18 LAB — TYPE AND SCREEN
ABO/RH(D): B POS
Antibody Screen: NEGATIVE

## 2019-05-18 LAB — CBC
HCT: 35.5 % — ABNORMAL LOW (ref 36.0–46.0)
Hemoglobin: 11.9 g/dL — ABNORMAL LOW (ref 12.0–15.0)
MCH: 27 pg (ref 26.0–34.0)
MCHC: 33.5 g/dL (ref 30.0–36.0)
MCV: 80.5 fL (ref 80.0–100.0)
Platelets: 175 10*3/uL (ref 150–400)
RBC: 4.41 MIL/uL (ref 3.87–5.11)
RDW: 11.4 % — ABNORMAL LOW (ref 11.5–15.5)
WBC: 7.3 10*3/uL (ref 4.0–10.5)
nRBC: 0 % (ref 0.0–0.2)

## 2019-05-18 LAB — SARS CORONAVIRUS 2 (TAT 6-24 HRS): SARS Coronavirus 2: NEGATIVE

## 2019-05-18 LAB — RPR: RPR Ser Ql: NONREACTIVE

## 2019-05-18 NOTE — MAU Note (Signed)
Pt here for lab draw and covid swab. Denies symptoms or sick contacts. Swab collected.  

## 2019-05-20 ENCOUNTER — Encounter (HOSPITAL_COMMUNITY)
Admission: RE | Disposition: A | Discharge status: Home / Self Care | Payer: Self-pay | Source: Home / Self Care | Attending: Obstetrics and Gynecology

## 2019-05-20 ENCOUNTER — Encounter (HOSPITAL_COMMUNITY): Payer: Self-pay | Admitting: Obstetrics and Gynecology

## 2019-05-20 ENCOUNTER — Inpatient Hospital Stay (HOSPITAL_COMMUNITY): Payer: Medicaid Other | Admitting: Anesthesiology

## 2019-05-20 ENCOUNTER — Other Ambulatory Visit: Payer: Self-pay

## 2019-05-20 ENCOUNTER — Inpatient Hospital Stay (HOSPITAL_COMMUNITY)
Admission: RE | Admit: 2019-05-20 | Discharge: 2019-05-23 | DRG: 785 | Disposition: A | Discharge status: Home / Self Care | Payer: Medicaid Other | Attending: Obstetrics and Gynecology | Admitting: Obstetrics and Gynecology

## 2019-05-20 ENCOUNTER — Other Ambulatory Visit (HOSPITAL_COMMUNITY): Payer: Medicaid Other

## 2019-05-20 ENCOUNTER — Ambulatory Visit (HOSPITAL_COMMUNITY): Payer: Medicaid Other

## 2019-05-20 DIAGNOSIS — O365931 Maternal care for other known or suspected poor fetal growth, third trimester, fetus 1: Secondary | ICD-10-CM | POA: Diagnosis present

## 2019-05-20 DIAGNOSIS — O30043 Twin pregnancy, dichorionic/diamniotic, third trimester: Secondary | ICD-10-CM | POA: Diagnosis present

## 2019-05-20 DIAGNOSIS — O34211 Maternal care for low transverse scar from previous cesarean delivery: Principal | ICD-10-CM | POA: Diagnosis present

## 2019-05-20 DIAGNOSIS — Z3A34 34 weeks gestation of pregnancy: Secondary | ICD-10-CM

## 2019-05-20 DIAGNOSIS — O321XX2 Maternal care for breech presentation, fetus 2: Secondary | ICD-10-CM | POA: Diagnosis present

## 2019-05-20 DIAGNOSIS — O365932 Maternal care for other known or suspected poor fetal growth, third trimester, fetus 2: Secondary | ICD-10-CM | POA: Diagnosis present

## 2019-05-20 DIAGNOSIS — O99214 Obesity complicating childbirth: Secondary | ICD-10-CM | POA: Diagnosis present

## 2019-05-20 DIAGNOSIS — O365930 Maternal care for other known or suspected poor fetal growth, third trimester, not applicable or unspecified: Secondary | ICD-10-CM | POA: Diagnosis not present

## 2019-05-20 DIAGNOSIS — Z302 Encounter for sterilization: Secondary | ICD-10-CM | POA: Diagnosis not present

## 2019-05-20 DIAGNOSIS — O34219 Maternal care for unspecified type scar from previous cesarean delivery: Secondary | ICD-10-CM | POA: Diagnosis not present

## 2019-05-20 DIAGNOSIS — Z98891 History of uterine scar from previous surgery: Secondary | ICD-10-CM

## 2019-05-20 SURGERY — Surgical Case
Anesthesia: Spinal | Laterality: Bilateral | Wound class: Clean Contaminated

## 2019-05-20 MED ORDER — NALBUPHINE HCL 10 MG/ML IJ SOLN: 5.0000 mg | Freq: Once | INTRAMUSCULAR | Status: DC | PRN

## 2019-05-20 MED ORDER — LACTATED RINGERS IV SOLN: INTRAVENOUS | Status: DC

## 2019-05-20 MED ORDER — METHYLERGONOVINE MALEATE 0.2 MG/ML IJ SOLN: 0.2000 mg | INTRAMUSCULAR | Status: DC | PRN

## 2019-05-20 MED ORDER — NALOXONE HCL 0.4 MG/ML IJ SOLN: 0.4000 mg | INTRAMUSCULAR | Status: DC | PRN

## 2019-05-20 MED ORDER — SOD CITRATE-CITRIC ACID 500-334 MG/5ML PO SOLN
30.0000 mL | ORAL | Status: AC
  Administered 2019-05-20: 30 mL via ORAL

## 2019-05-20 MED ORDER — FENTANYL CITRATE (PF) 100 MCG/2ML IJ SOLN: 25.0000 ug | INTRAMUSCULAR | Status: DC | PRN

## 2019-05-20 MED ORDER — IBUPROFEN 800 MG PO TABS
800.0000 mg | ORAL_TABLET | Freq: Three times a day (TID) | ORAL | Status: DC
  Administered 2019-05-21 – 2019-05-23 (×7): 800 mg via ORAL
  Filled 2019-05-20 (×8): qty 1

## 2019-05-20 MED ORDER — BUPIVACAINE IN DEXTROSE 0.75-8.25 % IT SOLN
INTRATHECAL | Status: DC | PRN
  Administered 2019-05-20: 2 mL via INTRATHECAL

## 2019-05-20 MED ORDER — METOCLOPRAMIDE HCL 5 MG/ML IJ SOLN: INTRAMUSCULAR | Status: DC | PRN

## 2019-05-20 MED ORDER — FENTANYL CITRATE (PF) 100 MCG/2ML IJ SOLN
INTRAMUSCULAR | Status: AC
  Filled 2019-05-20: qty 2

## 2019-05-20 MED ORDER — OXYCODONE HCL 5 MG PO TABS: 5.0000 mg | ORAL_TABLET | Freq: Once | ORAL | Status: DC | PRN

## 2019-05-20 MED ORDER — SIMETHICONE 80 MG PO CHEW
80.0000 mg | CHEWABLE_TABLET | ORAL | Status: DC
  Administered 2019-05-20 – 2019-05-22 (×3): 80 mg via ORAL
  Filled 2019-05-20 (×3): qty 1

## 2019-05-20 MED ORDER — OXYTOCIN 40 UNITS IN NORMAL SALINE INFUSION - SIMPLE MED
INTRAVENOUS | Status: DC | PRN
  Administered 2019-05-20: 40 [IU] via INTRAVENOUS

## 2019-05-20 MED ORDER — MEPERIDINE HCL 25 MG/ML IJ SOLN
INTRAMUSCULAR | Status: DC | PRN
  Administered 2019-05-20: 12.5 mg via INTRAVENOUS

## 2019-05-20 MED ORDER — METHYLERGONOVINE MALEATE 0.2 MG PO TABS: 0.2000 mg | ORAL_TABLET | ORAL | Status: DC | PRN

## 2019-05-20 MED ORDER — PRENATAL MULTIVITAMIN CH
1.0000 | ORAL_TABLET | Freq: Every day | ORAL | Status: DC
  Administered 2019-05-21 – 2019-05-23 (×3): 1 via ORAL
  Filled 2019-05-20 (×3): qty 1

## 2019-05-20 MED ORDER — SIMETHICONE 80 MG PO CHEW: 80.0000 mg | CHEWABLE_TABLET | ORAL | Status: DC | PRN

## 2019-05-20 MED ORDER — DEXAMETHASONE SODIUM PHOSPHATE 10 MG/ML IJ SOLN
INTRAMUSCULAR | Status: AC
  Filled 2019-05-20: qty 1

## 2019-05-20 MED ORDER — ZOLPIDEM TARTRATE 5 MG PO TABS: 5.0000 mg | ORAL_TABLET | Freq: Every evening | ORAL | Status: DC | PRN

## 2019-05-20 MED ORDER — OXYTOCIN 40 UNITS IN NORMAL SALINE INFUSION - SIMPLE MED
INTRAVENOUS | Status: AC
  Filled 2019-05-20: qty 1000

## 2019-05-20 MED ORDER — ONDANSETRON HCL 4 MG/2ML IJ SOLN: 4.0000 mg | Freq: Once | INTRAMUSCULAR | Status: DC | PRN

## 2019-05-20 MED ORDER — NALBUPHINE HCL 10 MG/ML IJ SOLN: 5.0000 mg | INTRAMUSCULAR | Status: DC | PRN

## 2019-05-20 MED ORDER — ONDANSETRON HCL 4 MG/2ML IJ SOLN: 4.0000 mg | Freq: Three times a day (TID) | INTRAMUSCULAR | Status: DC | PRN

## 2019-05-20 MED ORDER — DIPHENHYDRAMINE HCL 50 MG/ML IJ SOLN: 12.5000 mg | INTRAMUSCULAR | Status: DC | PRN

## 2019-05-20 MED ORDER — ONDANSETRON HCL 4 MG/2ML IJ SOLN
INTRAMUSCULAR | Status: AC
  Filled 2019-05-20: qty 2

## 2019-05-20 MED ORDER — SENNOSIDES-DOCUSATE SODIUM 8.6-50 MG PO TABS
2.0000 | ORAL_TABLET | ORAL | Status: DC
  Administered 2019-05-20 – 2019-05-22 (×3): 2 via ORAL
  Filled 2019-05-20 (×3): qty 2

## 2019-05-20 MED ORDER — HYDROMORPHONE HCL 1 MG/ML IJ SOLN: 0.2000 mg | INTRAMUSCULAR | Status: DC | PRN

## 2019-05-20 MED ORDER — MEPERIDINE HCL 25 MG/ML IJ SOLN: 6.2500 mg | INTRAMUSCULAR | Status: DC | PRN

## 2019-05-20 MED ORDER — WITCH HAZEL-GLYCERIN EX PADS: 1.0000 "application " | MEDICATED_PAD | CUTANEOUS | Status: DC | PRN

## 2019-05-20 MED ORDER — FENTANYL CITRATE (PF) 100 MCG/2ML IJ SOLN
INTRAMUSCULAR | Status: DC | PRN
  Administered 2019-05-20: 15 ug via INTRATHECAL

## 2019-05-20 MED ORDER — SCOPOLAMINE 1 MG/3DAYS TD PT72
1.0000 | MEDICATED_PATCH | Freq: Once | TRANSDERMAL | Status: DC
  Administered 2019-05-20: 1.5 mg via TRANSDERMAL

## 2019-05-20 MED ORDER — OXYCODONE HCL 5 MG/5ML PO SOLN: 5.0000 mg | Freq: Once | ORAL | Status: DC | PRN

## 2019-05-20 MED ORDER — SODIUM CHLORIDE 0.9% FLUSH: 3.0000 mL | INTRAVENOUS | Status: DC | PRN

## 2019-05-20 MED ORDER — DEXTROSE 5 % IV SOLN
3.0000 g | INTRAVENOUS | Status: AC
  Administered 2019-05-20: 3 g via INTRAVENOUS

## 2019-05-20 MED ORDER — ONDANSETRON HCL 4 MG/2ML IJ SOLN
INTRAMUSCULAR | Status: DC | PRN
  Administered 2019-05-20: 4 mg via INTRAVENOUS

## 2019-05-20 MED ORDER — KETOROLAC TROMETHAMINE 30 MG/ML IJ SOLN
INTRAMUSCULAR | Status: AC
  Filled 2019-05-20: qty 1

## 2019-05-20 MED ORDER — FERROUS SULFATE 325 (65 FE) MG PO TABS
325.0000 mg | ORAL_TABLET | Freq: Two times a day (BID) | ORAL | Status: DC
  Administered 2019-05-21 – 2019-05-23 (×5): 325 mg via ORAL
  Filled 2019-05-20 (×5): qty 1

## 2019-05-20 MED ORDER — STERILE WATER FOR IRRIGATION IR SOLN
Status: DC | PRN
  Administered 2019-05-20: 1000 mL

## 2019-05-20 MED ORDER — DIPHENHYDRAMINE HCL 25 MG PO CAPS: 25.0000 mg | ORAL_CAPSULE | Freq: Four times a day (QID) | ORAL | Status: DC | PRN

## 2019-05-20 MED ORDER — SOD CITRATE-CITRIC ACID 500-334 MG/5ML PO SOLN
ORAL | Status: AC
  Filled 2019-05-20: qty 30

## 2019-05-20 MED ORDER — NALOXONE HCL 4 MG/10ML IJ SOLN
1.0000 ug/kg/h | INTRAVENOUS | Status: DC | PRN
  Filled 2019-05-20: qty 5

## 2019-05-20 MED ORDER — METOCLOPRAMIDE HCL 5 MG/ML IJ SOLN
INTRAMUSCULAR | Status: AC
  Filled 2019-05-20: qty 2

## 2019-05-20 MED ORDER — SODIUM CHLORIDE 0.9 % IV SOLN: INTRAVENOUS | Status: DC | PRN

## 2019-05-20 MED ORDER — OXYCODONE HCL 5 MG PO TABS: 5.0000 mg | ORAL_TABLET | ORAL | Status: DC | PRN

## 2019-05-20 MED ORDER — DIBUCAINE (PERIANAL) 1 % EX OINT: 1.0000 "application " | TOPICAL_OINTMENT | CUTANEOUS | Status: DC | PRN

## 2019-05-20 MED ORDER — ACETAMINOPHEN 500 MG PO TABS
1000.0000 mg | ORAL_TABLET | Freq: Four times a day (QID) | ORAL | Status: DC
  Administered 2019-05-20 – 2019-05-23 (×9): 1000 mg via ORAL
  Filled 2019-05-20 (×11): qty 2

## 2019-05-20 MED ORDER — PHENYLEPHRINE HCL-NACL 20-0.9 MG/250ML-% IV SOLN
INTRAVENOUS | Status: AC
  Filled 2019-05-20: qty 250

## 2019-05-20 MED ORDER — SODIUM CHLORIDE 0.9 % IR SOLN
Status: DC | PRN
  Administered 2019-05-20: 1000 mL

## 2019-05-20 MED ORDER — MENTHOL 3 MG MT LOZG: 1.0000 | LOZENGE | OROMUCOSAL | Status: DC | PRN

## 2019-05-20 MED ORDER — PHENYLEPHRINE HCL-NACL 20-0.9 MG/250ML-% IV SOLN
INTRAVENOUS | Status: DC | PRN
  Administered 2019-05-20: 80 ug/min via INTRAVENOUS

## 2019-05-20 MED ORDER — DEXAMETHASONE SODIUM PHOSPHATE 10 MG/ML IJ SOLN
INTRAMUSCULAR | Status: DC | PRN
  Administered 2019-05-20: 10 mg via INTRAVENOUS

## 2019-05-20 MED ORDER — MORPHINE SULFATE (PF) 0.5 MG/ML IJ SOLN
INTRAMUSCULAR | Status: AC
  Filled 2019-05-20: qty 10

## 2019-05-20 MED ORDER — DIPHENHYDRAMINE HCL 25 MG PO CAPS: 25.0000 mg | ORAL_CAPSULE | ORAL | Status: DC | PRN

## 2019-05-20 MED ORDER — METOCLOPRAMIDE HCL 5 MG/ML IJ SOLN
INTRAMUSCULAR | Status: DC | PRN
Start: 2019-05-20 — End: 2019-05-20
  Administered 2019-05-20: 10 mg via INTRAVENOUS

## 2019-05-20 MED ORDER — MORPHINE SULFATE (PF) 0.5 MG/ML IJ SOLN
INTRAMUSCULAR | Status: DC | PRN
  Administered 2019-05-20: .15 mg via INTRATHECAL

## 2019-05-20 MED ORDER — KETOROLAC TROMETHAMINE 30 MG/ML IJ SOLN
30.0000 mg | Freq: Once | INTRAMUSCULAR | Status: AC | PRN
  Administered 2019-05-20: 30 mg via INTRAVENOUS

## 2019-05-20 MED ORDER — MEPERIDINE HCL 25 MG/ML IJ SOLN
INTRAMUSCULAR | Status: AC
  Filled 2019-05-20: qty 1

## 2019-05-20 MED ORDER — SIMETHICONE 80 MG PO CHEW
80.0000 mg | CHEWABLE_TABLET | Freq: Three times a day (TID) | ORAL | Status: DC
  Administered 2019-05-21 – 2019-05-23 (×7): 80 mg via ORAL
  Filled 2019-05-20 (×7): qty 1

## 2019-05-20 MED ORDER — ENOXAPARIN SODIUM 80 MG/0.8ML ~~LOC~~ SOLN
65.0000 mg | SUBCUTANEOUS | Status: DC
  Administered 2019-05-21 – 2019-05-22 (×2): 65 mg via SUBCUTANEOUS
  Filled 2019-05-20 (×2): qty 0.8

## 2019-05-20 MED ORDER — COCONUT OIL OIL
1.0000 "application " | TOPICAL_OIL | Status: DC | PRN
  Administered 2019-05-22: 1 via TOPICAL

## 2019-05-20 MED ORDER — OXYTOCIN 40 UNITS IN NORMAL SALINE INFUSION - SIMPLE MED: 2.5000 [IU]/h | INTRAVENOUS | Status: AC

## 2019-05-20 MED ORDER — SCOPOLAMINE 1 MG/3DAYS TD PT72
MEDICATED_PATCH | TRANSDERMAL | Status: AC
  Filled 2019-05-20: qty 1

## 2019-05-20 SURGICAL SUPPLY — 45 items
APL SKNCLS STERI-STRIP NONHPOA (GAUZE/BANDAGES/DRESSINGS) ×1
BARRIER ADHS 3X4 INTERCEED (GAUZE/BANDAGES/DRESSINGS) ×2 IMPLANT
BENZOIN TINCTURE PRP APPL 2/3 (GAUZE/BANDAGES/DRESSINGS) ×3 IMPLANT
BRR ADH 4X3 ABS CNTRL BYND (GAUZE/BANDAGES/DRESSINGS) ×1
CHLORAPREP W/TINT 26ML (MISCELLANEOUS) ×3 IMPLANT
CLAMP CORD UMBIL (MISCELLANEOUS) IMPLANT
CLOSURE WOUND 1/2 X4 (GAUZE/BANDAGES/DRESSINGS) ×1
CLOSURE WOUND 1/4X4 (GAUZE/BANDAGES/DRESSINGS) ×1
CLOTH BEACON ORANGE TIMEOUT ST (SAFETY) ×3 IMPLANT
DRESSING PREVENA PLUS CUSTOM (GAUZE/BANDAGES/DRESSINGS) IMPLANT
DRSG OPSITE POSTOP 4X10 (GAUZE/BANDAGES/DRESSINGS) ×3 IMPLANT
DRSG PREVENA PLUS CUSTOM (GAUZE/BANDAGES/DRESSINGS) ×3
ELECT REM PT RETURN 9FT ADLT (ELECTROSURGICAL) ×3
ELECTRODE REM PT RTRN 9FT ADLT (ELECTROSURGICAL) ×1 IMPLANT
EXTRACTOR VACUUM KIWI (MISCELLANEOUS) IMPLANT
GLOVE BIOGEL M 7.0 STRL (GLOVE) ×6 IMPLANT
GLOVE BIOGEL PI IND STRL 7.0 (GLOVE) ×3 IMPLANT
GLOVE BIOGEL PI INDICATOR 7.0 (GLOVE) ×6
GOWN STRL REUS W/TWL LRG LVL3 (GOWN DISPOSABLE) ×9 IMPLANT
HEMOSTAT ARISTA ABSORB 3G PWDR (HEMOSTASIS) ×4 IMPLANT
HOVERMATT SINGLE USE (MISCELLANEOUS) ×2 IMPLANT
KIT ABG SYR 3ML LUER SLIP (SYRINGE) IMPLANT
NDL HYPO 25X5/8 SAFETYGLIDE (NEEDLE) IMPLANT
NEEDLE HYPO 25X5/8 SAFETYGLIDE (NEEDLE) IMPLANT
NS IRRIG 1000ML POUR BTL (IV SOLUTION) ×3 IMPLANT
PACK C SECTION WH (CUSTOM PROCEDURE TRAY) ×3 IMPLANT
PAD OB MATERNITY 4.3X12.25 (PERSONAL CARE ITEMS) ×3 IMPLANT
PENCIL SMOKE EVAC W/HOLSTER (ELECTROSURGICAL) ×3 IMPLANT
PREVENA RESTOR AXIOFORM 29X28 (GAUZE/BANDAGES/DRESSINGS) ×2 IMPLANT
RTRCTR C-SECT PINK 25CM LRG (MISCELLANEOUS) IMPLANT
SPONGE LAP 18X18 RF (DISPOSABLE) ×2 IMPLANT
STRIP CLOSURE SKIN 1/2X4 (GAUZE/BANDAGES/DRESSINGS) ×2 IMPLANT
STRIP CLOSURE SKIN 1/4X4 (GAUZE/BANDAGES/DRESSINGS) ×1 IMPLANT
SUT PDS AB 0 CT1 27 (SUTURE) ×6 IMPLANT
SUT PLAIN 0 NONE (SUTURE) IMPLANT
SUT VIC AB 0 CTX 36 (SUTURE) ×9
SUT VIC AB 0 CTX36XBRD ANBCTRL (SUTURE) ×3 IMPLANT
SUT VIC AB 2-0 CT1 27 (SUTURE) ×6
SUT VIC AB 2-0 CT1 TAPERPNT 27 (SUTURE) ×1 IMPLANT
SUT VIC AB 3-0 SH 27 (SUTURE)
SUT VIC AB 3-0 SH 27X BRD (SUTURE) IMPLANT
SUT VIC AB 4-0 KS 27 (SUTURE) ×3 IMPLANT
TOWEL OR 17X24 6PK STRL BLUE (TOWEL DISPOSABLE) ×3 IMPLANT
TRAY FOLEY W/BAG SLVR 14FR LF (SET/KITS/TRAYS/PACK) ×3 IMPLANT
WATER STERILE IRR 1000ML POUR (IV SOLUTION) ×3 IMPLANT

## 2019-05-20 NOTE — Progress Notes (Signed)
Removed epidural catheter at 1925, catheter tip in place.  Jillian Ruiz

## 2019-05-20 NOTE — Anesthesia Procedure Notes (Signed)
Spinal  Patient location during procedure: OR Staffing Performed: anesthesiologist  Anesthesiologist: Lucretia Kern, MD Preanesthetic Checklist Completed: patient identified, IV checked, risks and benefits discussed, surgical consent, monitors and equipment checked, pre-op evaluation and timeout performed Spinal Block Patient position: sitting Prep: DuraPrep Patient monitoring: continuous pulse ox, blood pressure and heart rate Approach: midline Location: L3-4 Injection technique: catheter Needle Needle type: Pencan and Tuohy  Needle gauge: 25 G Needle length: 12.7 cm Catheter type: closed end flexible Catheter size: 19 g Additional Notes The patient was prepped and draped in the usual sterile fashion. A combined spinal-epidural technique was performed with a 9 cm 17g Tuohy needle and loss of resistance technique. After encountering LOR, a 12.7 cm 25g Pencan spinal needle was introduced via the Tuohy and clear CSF was aspirated prior to injection of local anesthetic. The spinal needle was removed and a 19 g flexible epidural catheter placed prior to Tuohy removal. Patient tolerated the procedure well without complications.  LOR 9 cm, catheter @14  cm

## 2019-05-20 NOTE — H&P (Signed)
Jillian Ruiz is a 29 y.o. female presenting for repeat cesarean section due to Di/Di twin pregnancy with Severe  IUGR of twin A and B.. H/o Cesearean section pt desires repeat.. prenatal care provided by Dr. Gerald Leitz with [facility].  OB History    Gravida  3   Para  2   Term  2   Preterm      AB      Living  2     SAB      TAB      Ectopic      Multiple  0   Live Births  1          Past Medical History:  Diagnosis Date  . HSV (herpes simplex virus) anogenital infection   . Medical history non-contributory    Past Surgical History:  Procedure Laterality Date  . CESAREAN SECTION    . CESAREAN SECTION N/A 02/01/2014   Procedure: REPEAT CESAREAN SECTION;  Surgeon: Dorien Chihuahua. Richardson Dopp, MD;  Location: [facility];  Service: Obstetrics;  Laterality: N/A;   Family History: family history includes Depression in her mother. Social History:  reports that she has never smoked. She has never used smokeless tobacco. She reports that she does not drink alcohol or use drugs.     Maternal Diabetes: No Genetic Screening: Normal Maternal Ultrasounds/Referrals: Normal Fetal Ultrasounds or other Referrals:  Referred to Materal Fetal Medicine  Maternal Substance Abuse:  No Significant Maternal Medications:  None Significant Maternal Lab Results:  Group B Strep negative Other Comments:  None  Review of Systems  Constitutional: Negative.   HENT: Negative.   Eyes: Negative.   Respiratory: Negative.   Cardiovascular: Negative.   Gastrointestinal: Negative.   Endocrine: Negative.   Genitourinary: Negative.   Musculoskeletal: Negative.   Skin: Negative.   Allergic/Immunologic: Negative.   Neurological: Negative.   Hematological: Negative.   Psychiatric/Behavioral: Negative.    History   Blood pressure 121/75, pulse 88, temperature 98.4 F (36.9 C), temperature source Oral, height 5\' 7"  (1.702 m), weight 131.1 kg, last menstrual period 09/15/2018, unknown if currently  breastfeeding. Exam Physical Exam  Vitals reviewed. Constitutional: She is oriented to person, place, and time. She appears well-developed and well-nourished.  HENT:  Head: Normocephalic and atraumatic.  Eyes: Pupils are equal, round, and reactive to light. Conjunctivae are normal.  Cardiovascular: Normal rate and regular rhythm.  Respiratory: Effort normal and breath sounds normal.  GI: There is no abdominal tenderness.  Genitourinary:    Vagina normal.   Musculoskeletal:        General: Edema present. Normal range of motion.     Cervical back: Normal range of motion.  Neurological: She is alert and oriented to person, place, and time.  Skin: Skin is warm and dry.  Psychiatric: She has a normal mood and affect.    Prenatal labs: ABO, Rh: --/--/B POS, B POS Performed at [facility], [address], [place], [place]  [id number] (06/23) Antibody: NEG (05/03 0934) Rubella: Immune (10/28 0000) RPR: NON REACTIVE (05/03 0935)  HBsAg: Negative (10/28 0000)  HIV: Non-reactive (10/28 0000)  GBS:   Negative   Assessment/Plan: 34 wks and 0 days di/ di twin pregnancy with severe IUGR of Twin A and B with h/o cesarean section.  Delivery recommended by 34 wks by MFM.. r/b/a of cesarean section discussed with the patient including but not limited to infection bleeding damage to bowel bladder baby with the need for further surgery. Pt  voiced understanding and desires to proceed.  She also desires bilateral tubal ligation at the time of cesarean section. R/B/A of this discussed as well including 1% risk of failure with 50 % risk of ectopic if failure occurs .. pt voiced understanding and desires to proceed.    Christophe Louis 05/20/2019, 3:53 PM

## 2019-05-20 NOTE — Transfer of Care (Signed)
Immediate Anesthesia Transfer of Care Note  Patient: Jillian Ruiz  Procedure(s) Performed: CESAREAN SECTION MULTI-GESTATIONAL WITH TUBAL (Bilateral )  Patient Location: PACU  Anesthesia Type:Spinal and Epidural  Level of Consciousness: awake, alert , oriented and patient cooperative  Airway & Oxygen Therapy: Patient Spontanous Breathing  Post-op Assessment: Report given to RN and Post -op Vital signs reviewed and stable  Post vital signs: Reviewed and stable  Last Vitals:  Vitals Value Taken Time  BP    Temp    Pulse    Resp    SpO2      Last Pain:  Vitals:   05/20/19 1407  TempSrc: Oral  PainSc: 0-No pain         Complications: No apparent anesthesia complications

## 2019-05-20 NOTE — Anesthesia Preprocedure Evaluation (Signed)
Anesthesia Evaluation  Patient identified by MRN, date of birth, ID band Patient awake    Reviewed: Allergy & Precautions, H&P , NPO status , Patient's Chart, lab work & pertinent test results  History of Anesthesia Complications Negative for: history of anesthetic complications  Airway Mallampati: II  TM Distance: >3 FB Neck ROM: full    Dental no notable dental hx.    Pulmonary neg pulmonary ROS,    Pulmonary exam normal        Cardiovascular negative cardio ROS Normal cardiovascular exam     Neuro/Psych negative neurological ROS  negative psych ROS   GI/Hepatic negative GI ROS, Neg liver ROS,   Endo/Other  Morbid obesity  Renal/GU negative Renal ROS  negative genitourinary   Musculoskeletal   Abdominal   Peds  Hematology negative hematology ROS (+)   Anesthesia Other Findings  Twin gestation 1 prior C/S  Reproductive/Obstetrics (+) Pregnancy                             Anesthesia Physical Anesthesia Plan  ASA: III  Anesthesia Plan: Spinal   Post-op Pain Management:    Induction:   PONV Risk Score and Plan: Ondansetron and Treatment may vary due to age or medical condition  Airway Management Planned:   Additional Equipment:   Intra-op Plan:   Post-operative Plan:   Informed Consent: I have reviewed the patients History and Physical, chart, labs and discussed the procedure including the risks, benefits and alternatives for the proposed anesthesia with the patient or authorized representative who has indicated his/her understanding and acceptance.       Plan Discussed with:   Anesthesia Plan Comments:         Anesthesia Quick Evaluation

## 2019-05-20 NOTE — Op Note (Signed)
Cesarean Section Procedure Note and Bilateral tubal ligation   Indications: 34 wks di / di twins with severe IUGR and h/o cesarean section x 2  Pre-operative Diagnosis: 34 week 0 day pregnancy.. IUGR/ Di/di twin pregnancy / malpresentation of Twin B  Post-operative Diagnosis: same  Surgeon: Gerald Leitz M.D.  Assistants: Dr. Myna Hidalgo assisted due to the complexity of the surgery and concern for pelvic adhesive disease   Anesthesia: Spinal anesthesia  ASA Class: 2   Procedure Details   The patient was seen in the Holding Room. The risks, benefits, complications, treatment options, and expected outcomes were discussed with the patient.  The patient concurred with the proposed plan, giving informed consent.  The site of surgery properly noted/marked. The patient was taken to Operating Room # A, identified as Jillian Ruiz and the procedure verified as C-Section Delivery. A Time Out was held and the above information confirmed.  After induction of anesthesia, the patient was draped and prepped in the usual sterile manner. A Pfannenstiel incision was made and carried down through the subcutaneous tissue to the fascia. Fascial incision was made and extended transversely. The fascia was separated from the underlying rectus tissue superiorly and inferiorly. The peritoneum was identified and entered. Peritoneal incision was extended longitudinally. The utero-vesical peritoneal reflection was incised transversely and the bladder flap was bluntly freed from the lower uterine segment. A low transverse uterine incision was made. Delivered from cephalic presentation was a Female Fetus and delivered form a breech presentation was a female fetus ( weights are pending)  with Apgar scores for Twin A of 8 at one minute and 9 at five minutes. Apgar scores for twin B were5 at one minute and 9 at five minutes.   After the umbilical cord was clamped and cut cord blood was obtained for evaluation. The placenta was  removed intact and appeared normal. The uterine outline, tubes and ovaries appeared normal. The uterine incision was closed with running locked sutures of 0 vicryl. A second layer of 0 vicryl was used to imbricate the incision. . Hemostasis was observed. Lavage was carried out until clear.  The left fallopian tube was grasped with the babcock clamp. A window was made in the mesosalpinx with the bovie. The tube was tied medially and laterally with 0 plain gut . The intervening segment was excised with the scissors. This was repeated on the right fallopian tube. Interseed was placed along the uterine incision. Pt was noted to have slight oozing from the rectus muscle.. hemostasis was achieved with the bovie followed by application of arista. . The fascia was then reapproximated with running sutures of 0 PDS. Marland Kitchen The skin was reapproximated with 4-0 vicryl .  Instrument, sponge, and needle counts were correct prior the abdominal closure and at the conclusion of the case.   Findings: Baby A in the cephalic presentation baby B in the breech presentation. Normal fallopian tubes and ovaries   Estimated Blood Loss:  300 mL         Drains: None         Total IV Fluids:   Per anesthesia ml         Specimens: Placenta and Disposition:  Sent to Pathology.. portion of bilateral fallopian tubes to pathology           Implants: None         Complications:  None; patient tolerated the procedure well.         Disposition: PACU - hemodynamically stable.  Condition: stable  Attending Attestation: I performed the procedure.

## 2019-05-21 LAB — CBC
HCT: 33 % — ABNORMAL LOW (ref 36.0–46.0)
Hemoglobin: 11 g/dL — ABNORMAL LOW (ref 12.0–15.0)
MCH: 27.1 pg (ref 26.0–34.0)
MCHC: 33.3 g/dL (ref 30.0–36.0)
MCV: 81.3 fL (ref 80.0–100.0)
Platelets: 177 10*3/uL (ref 150–400)
RBC: 4.06 MIL/uL (ref 3.87–5.11)
RDW: 11.4 % — ABNORMAL LOW (ref 11.5–15.5)
WBC: 11.6 10*3/uL — ABNORMAL HIGH (ref 4.0–10.5)
nRBC: 0 % (ref 0.0–0.2)

## 2019-05-21 LAB — CREATININE, SERUM
Creatinine, Ser: 0.75 mg/dL (ref 0.44–1.00)
GFR calc Af Amer: >60 mL/min (ref >60)
GFR calc non Af Amer: >60 mL/min (ref >60)

## 2019-05-21 NOTE — Lactation Note (Signed)
This note was copied from a baby's chart. Lactation Consultation Note  Patient Name: Devina Bezold Today's Date: 05/21/2019 Reason for consult: Initial assessment;NICU baby   Twins 15 hours old in NICU.. [redacted]w[redacted]d.  < 4 lbs.  Mother states she bf her first child for 6 mos & second child for 9 mos. Her last child is 31 years old.  Mother states she was leaking colostrum during pregnancy. Reviewed hand expression with drops expressed.   Set up DEBP and recommend pumping 2-.25 hours during the day and q 4 hours at night for a minimum of 8 times per day. 24 flanges are appropriate size for mother.  Reviewed cleaning, milk storage, NICU booklet, colostrum containers and labels. Mother has 2 DEBPs pumps at home and has pumped before.  Mother states she has not started pumping soon due to N&V.  Provided mother w/ lactation brochure.       Maternal Data Has patient been taught Hand Expression?: Yes Does the patient have breastfeeding experience prior to this delivery?: Yes  Feeding    LATCH Score                   Interventions Interventions: Hand express;DEBP;Hand pump  Lactation Tools Discussed/Used Pump Review: Setup, frequency, and cleaning;Milk Storage Initiated by:: Dahlia Byes  Date initiated:: 05/21/19   Consult Status Consult Status: Follow-up Date: 05/22/19 Follow-up type: In-patient    Dahlia Byes [facility] 05/21/2019, 9:10 AM

## 2019-05-21 NOTE — Anesthesia Postprocedure Evaluation (Signed)
Anesthesia Post Note  Patient: Jillian Ruiz  Procedure(s) Performed: CESAREAN SECTION MULTI-GESTATIONAL WITH TUBAL (Bilateral )     Patient location during evaluation: PACU Anesthesia Type: Spinal Level of consciousness: oriented and awake and alert Pain management: pain level controlled Vital Signs Assessment: post-procedure vital signs reviewed and stable Respiratory status: spontaneous breathing, respiratory function stable and nonlabored ventilation Cardiovascular status: blood pressure returned to baseline and stable Postop Assessment: no headache, no backache, no apparent nausea or vomiting and spinal receding Anesthetic complications: no    Last Vitals:  Vitals:   05/21/19 0749 05/21/19 0750  BP:  106/63  Pulse:  (!) 59  Resp:  18  Temp:  37 C  SpO2: 100% 100%    Last Pain:  Vitals:   05/21/19 0951  TempSrc:   PainSc: 0-No pain                 Lucretia Kern

## 2019-05-21 NOTE — Plan of Care (Signed)
  Problem: Education: Goal: Knowledge of condition will improve Outcome: Progressing   Problem: Education: Goal: Knowledge of condition will improve Outcome: Progressing   Problem: Education: Goal: Knowledge of condition will improve Outcome: Progressing   Problem: Activity: Goal: Will verbalize the importance of balancing activity with adequate rest periods Outcome: Progressing Goal: Ability to tolerate increased activity will improve Outcome: Progressing   Problem: Coping: Goal: Ability to identify and utilize available resources and services will improve Outcome: Progressing   Problem: Life Cycle: Goal: Chance of risk for complications during the postpartum period will decrease Outcome: Progressing   Problem: Role Relationship: Goal: Ability to demonstrate positive interaction with newborn will improve Outcome: Progressing

## 2019-05-21 NOTE — Progress Notes (Signed)
Postop Note Day # 1  S:  Patient resting comfortable in bed.  Pain controlled.  Tolerating some clears. No flatus, no BM.  Lochia minimal.  Not yet up ambulating, did stand without issues.  Some nausea, no vomiting.  No F/C, SOB, or CP.  Pt plans on breastfeeding.  O: Temp:  [97.6 F (36.4 C)-98.4 F (36.9 C)] 98 F (36.7 C) (05/06 0521) Pulse Rate:  [59-88] 80 (05/06 0536) Resp:  [13-23] 18 (05/06 0521) BP: (70-161)/(54-99) 101/73 (05/06 0536) SpO2:  [96 %-100 %] 100 % (05/06 0539) Weight:  [131.1 kg] 131.1 kg (05/05 1407)   Gen: A&Ox3, NAD CV: RRR Resp: CTAB Abdomen: obese soft, NT, ND, +BS Uterus: firm, non-tender, below umbilicus Incision: c/d/i, bandage on Ext: No edema, no calf tenderness bilaterally, SCDs in place  Labs:  Recent Labs    05/18/19 0935 05/21/19 0641  HGB 11.9* 11.0*    A/P: Pt is a 29 y.o. [id number] s/p repeat C-section and bilateral tubal ligation, POD#1  - Pain well controlled -GU: Foley in place, if volume improves, plan to remove foley later today -GI: Tolerating general diet -Activity: encouraged sitting up to chair and ambulation as tolerated -Prophylaxis: SCDs in place, plan for Lovenox to start later today -Labs: stable as above  Myna Hidalgo, DO [phone] (cell) [phone] (office)

## 2019-05-21 NOTE — Clinical Social Work Maternal (Signed)
CLINICAL SOCIAL WORK MATERNAL/CHILD NOTE  Patient Details  Name: Jillian Ruiz MRN: [id number] Date of Birth: 01/16/1990  Date:  05/21/2019  Clinical Social Worker Initiating Note:  Shannan Garfinkel, LCSW Date/Time: Initiated:  05/21/19/1407     Child's Name:  Girl: Jenesys McKoy  Boy: Terence McKoy Jr.   Biological Parents:  Mother, Father(Father: Terence McKoy)   Need for Interpreter:  None   Reason for Referral:  Parental Support of Premature Babies < 32 weeks/or Critically Ill babies   Address:  [address] [place]  [address]    Phone number:  [phone] (home)     Additional phone number:   Household Members/Support Persons (HM/SP):   Household Member/Support Person 1, Household Member/Support Person 2, Household Member/Support Person 3   HM/SP Name Relationship DOB or Age  HM/SP -1 Terence McKoy FOB    HM/SP -2 Janesta McDonald daughter 02/01/14  HM/SP -3 Antonio McDonald son 02/06/07  HM/SP -4        HM/SP -5        HM/SP -6        HM/SP -7        HM/SP -8          Natural Supports (not living in the home):  Immediate Family, Parent, Extended Family, Friends   Professional Supports: None   Employment: Full-time   Type of Work: Head Housekeeper   Education:  Some College   Homebound arranged:    Financial Resources:  Medicaid   Other Resources:  WIC, Food Stamps    Cultural/Religious Considerations Which May Impact Care:    Strengths:  Ability to meet basic needs , Pediatrician chosen, Home prepared for child , Understanding of illness   Psychotropic Medications:         Pediatrician:    [place] area  Pediatrician List:   [place] [facility]  [place]    [place]    [place]    [place]    [place]      Pediatrician Fax Number:    Risk Factors/Current Problems:  None   Cognitive State:  Able to Concentrate , Alert , Linear Thinking , Insightful , Goal Oriented     Mood/Affect:  Calm , Happy , Comfortable , Interested    CSW Assessment: CSW met with MOB at infants bedside to discuss infants NICU admissions. CSW introduced self and explained reason for visit. MOB was welcoming, talkative and remained engaged during assessment. MOB reported that she resides with her FOB and two older children. 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 MOB reported that she receives both WIC and food stamps. MOB reported that she has all items needed to care for infants including 2 car seats, double basinet, single basinet and crib. CSW inquired about MOB's support system, MOB reported that FOB, her mom, grandma, FOB's sister, brother, aunts and best friend are her supports.   CSW inquired about MOB's mental health history, MOB denied any mental health history. MOB denied any history of postpartum depression with her two older children. CSW inquired about how MOB was feeling emotionally after giving birth, MOB reported that she felt fine. MOB presented calm and did not demonstrate any acute mental health signs/symptoms. CSW assessed for safety, MOB denied SI, HI and domestic violence.   CSW provided education regarding the baby blues period vs. perinatal mood disorders, discussed treatment and gave resources for mental health follow up if concerns   arise.  CSW recommends self-evaluation during the postpartum time period using the New Mom Checklist from Postpartum Progress and encouraged MOB to contact a medical professional if symptoms are noted at any time. MOB was receptive to information and reported that she has close family/friends with mental health history that keep a close eye on her and will let her know if she needs to seek any help.   CSW provided review of Sudden Infant Death Syndrome (SIDS) precautions.    CSW and MOB discussed infants NICU admissions. MOB reported that it has been a really good experience and she feels  informed about infants care. CSW informed MOB about the NICU, what to expect and resources/supports available while infants are admitted to the NICU. MOB denied any transportation barriers with visiting infants in the NICU. CSW informed MOB that infant (Girl Jenesys) qualifies to apply for SSI. CSW informed MOB about SSI benefits and application process. CSW provided MOB with SSI paperwork. MOB denied any additional needs/concerns.   CSW will continue to offer resources/supports while infants are admitted to the NICU.   CSW Plan/Description:  Sudden Infant Death Syndrome (SIDS) Education, Perinatal Mood and Anxiety Disorder (PMADs) Education, Supplemental Security Income (SSI) Information, Other Patient/Family Education    Emberly Tomasso L Revis Whalin, LCSW 05/21/2019, 2:11 PM  

## 2019-05-22 LAB — SURGICAL PATHOLOGY

## 2019-05-22 NOTE — Lactation Note (Signed)
This note was copied from a baby's chart. Lactation Consultation Note  Patient Name: Jillian Ruiz [id number] Date: 05/22/2019 Reason for consult: Follow-up assessment;NICU baby  Mom called for assist with hand expression. Mom was able to do hand expression on her own & get drops of colostrum/transitional milk, but I also taught her a different technique that didn't require compressing the base of the nipple. The milk seen with hand expression looked more like transitional milk. Mom reported that a week ago she felt engorged, so she expressed her milk & was able to get 1 oz out of each breast.   I assisted Mom with finding the right suction on her pump & observed that size 24 flanges are appropriate for her at this time.  I talked to Mom about pumping once during the night so that she wouldn't have such a long time period without breast stimulation. Mom was agreeable to that.   Lurline Hare [facility] 05/22/2019, 10:09 AM

## 2019-05-22 NOTE — Progress Notes (Signed)
Subjective: Postpartum Day 2: Cesarean Delivery Patient reports tolerating PO, + flatus and no problems voiding.    Objective: Vital signs in last 24 hours: Temp:  [97.9 F (36.6 C)-98.8 F (37.1 C)] 98 F (36.7 C) (05/07 0810) Pulse Rate:  [70-92] 75 (05/07 0810) Resp:  [16-18] 18 (05/07 0810) BP: (92-131)/(57-79) 130/78 (05/07 0810) SpO2:  [99 %-100 %] 100 % (05/07 0810)  Physical Exam:  General: alert, cooperative and no distress Lochia: appropriate Uterine Fundus: firm Incision: prevena dressing in place and functioning DVT Evaluation: No evidence of DVT seen on physical exam.  Recent Labs    05/21/19 0641  HGB 11.0*  HCT 33.0*    Assessment/Plan: Status post Cesarean section. Doing well postoperatively.  Continue current care Encourage ambulation  Pain well controlled. Continue ibuprofen and tylenol scheduled. Oxycodone for break through pain  Plan discharge home tomorrow .  Gerald Leitz 05/22/2019, 12:35 PM

## 2019-05-22 NOTE — Lactation Note (Signed)
This note was copied from a baby's chart. Lactation Consultation Note  Patient Name: [name] GAIDK'S Date: 05/22/2019    Infants are now 28 hrs old. I visited with Mom in her room (Room 104). She was disappointed that her milk was not coming to volume, yet. I reassured her that it was still too early for that.  Mom asked if she needed to pump q2hrs; I suggested q3hrs. Mom last pumped at 2100 yesterday, she plans to pump as soon as she has eaten breakfast.   Mom commented that she isn't getting much with pumping, I explained that doing hand expression can be helpful. Mom may call for me to return later to assist her with her hand expression technique.     Lurline Hare [facility] 05/22/2019, 8:51 AM

## 2019-05-23 MED ORDER — IBUPROFEN 800 MG PO TABS: 800.0000 mg | ORAL_TABLET | Freq: Three times a day (TID) | ORAL | 1 refills | Status: DC | PRN

## 2019-05-23 MED ORDER — OXYCODONE HCL 5 MG PO TABS: 5.0000 mg | ORAL_TABLET | ORAL | 0 refills | Status: AC | PRN

## 2019-05-23 NOTE — Plan of Care (Signed)
  Problem: Activity: Goal: Risk for activity intolerance will decrease Outcome: Completed/Met   Problem: Nutrition: Goal: Adequate nutrition will be maintained Outcome: Completed/Met   Problem: Coping: Goal: Level of anxiety will decrease Outcome: Completed/Met   Problem: Elimination: Goal: Will not experience complications related to bowel motility Outcome: Completed/Met Goal: Will not experience complications related to urinary retention Outcome: Completed/Met   Problem: Pain Managment: Goal: General experience of comfort will improve Outcome: Completed/Met   Problem: Education: Goal: Knowledge of condition will improve Outcome: Completed/Met Goal: Individualized Educational Video(s) Outcome: Completed/Met   Problem: Activity: Goal: Will verbalize the importance of balancing activity with adequate rest periods Outcome: Completed/Met Goal: Ability to tolerate increased activity will improve Outcome: Completed/Met   Problem: Coping: Goal: Ability to identify and utilize available resources and services will improve Outcome: Completed/Met   Problem: Life Cycle: Goal: Chance of risk for complications during the postpartum period will decrease Outcome: Completed/Met   Problem: Role Relationship: Goal: Ability to demonstrate positive interaction with newborn will improve Outcome: Completed/Met   Problem: Skin Integrity: Goal: Demonstration of wound healing without infection will improve Outcome: Completed/Met   Problem: Clinical Measurements: Goal: Respiratory complications will improve Outcome: Not Applicable Goal: Cardiovascular complication will be avoided Outcome: Not Applicable

## 2019-05-23 NOTE — Discharge Summary (Signed)
OB Discharge Summary     Patient Name: Jillian Ruiz DOB: 1990-02-07 MRN: [id number]  Date of admission: 05/20/2019 Delivering MD:    Jersie, Beel [[id number]]  Dealie, Koelzer [[id number]]  Gerald Leitz    Date of discharge: 05/23/2019  Admitting diagnosis: S/P cesarean section [Z98.891]/ Di/ di twin pregnancy / Intrauterine growth restriction   Intrauterine pregnancy: [redacted]w[redacted]d     Secondary diagnosis:  Active Problems:   S/P cesarean section  Additional problems: Morbid obesity      Discharge diagnosis: Preterm Pregnancy Delivered and di/ di twin pregnancy with intrauterine growth restriction of baby A and B                                                                                                Post partum procedures:None  Augmentation: NA  Complications: None  Hospital course:  Sceduled C/S   29 y.o. yo [id number] at [redacted]w[redacted]d was admitted to the hospital 05/20/2019 for scheduled cesarean section with the following indication:Elective Repeat, Malpresentation and Prior Uterine Surgery.  Membrane Rupture Time/Date:    Raeann, Offner [[id number]]  5:10 PM    Eisha, Chatterjee [[id number]]  5:11 PM  ,   Katheen, Aslin [[id number]]  05/20/2019    Zaylynn, Rickett [[id number]]  05/20/2019    Patient delivered a Viable infant.   Camilah, Spillman Girl Farhiya [[id number]]  05/20/2019    Douglas, Rooks [[id number]]  05/20/2019   Details of operation can be found in separate operative note.  Pateint had an uncomplicated postpartum course.  She is ambulating, tolerating a regular diet, passing flatus, and urinating well. Patient is discharged home in stable condition on  05/23/19         Physical exam  Vitals:   05/22/19 1714 05/22/19 1942 05/22/19 2249 05/23/19 0809  BP: (!) 144/80 127/79 108/81 129/84  Pulse: 90 100 96 79  Resp: 18 18 18 18   Temp: 98.5 F (36.9 C) 98.6 F (37 C) 98.1 F (36.7 C) 98 F (36.7 C)  TempSrc: Oral Oral  Oral Oral  SpO2: 100% 100% 99% 98%  Weight:      Height:       General: alert, cooperative and no distress Lochia: appropriate Uterine Fundus: firm Incision: Dressing is clean, dry, and intact DVT Evaluation: No evidence of DVT seen on physical exam. Labs: Lab Results  Component Value Date   WBC 11.6 (H) 05/21/2019   HGB 11.0 (L) 05/21/2019   HCT 33.0 (L) 05/21/2019   MCV 81.3 05/21/2019   PLT 177 05/21/2019   CMP Latest Ref Rng & Units 05/21/2019  Glucose 65 - 99 mg/dL -  BUN 6 - 20 mg/dL -  Creatinine 07/21/2019 - 6.20 mg/dL 3.55  Sodium 9.74 - 163 mmol/L -  Potassium 3.5 - 5.1 mmol/L -  Chloride 101 - 111 mmol/L -  CO2 22 - 32 mmol/L -  Calcium 8.9 - 10.3 mg/dL -    Discharge instruction: per After Visit Summary and "Baby and Me Booklet".  After visit meds:  Allergies as of 05/23/2019  Reactions   Apple    Fruit & Vegetable Daily [nutritional Supplements]    With skin on it( fresh fruit)   Peach [prunus Persica]    Pear    Pineapple       Medication List    STOP taking these medications   aspirin EC 81 MG tablet     TAKE these medications   acetaminophen 500 MG tablet Commonly known as: TYLENOL Take 1,000 mg by mouth every 6 (six) hours as needed for mild pain or headache.   Claritin 10 MG tablet Generic drug: loratadine Take 10 mg by mouth daily.   ibuprofen 800 MG tablet Commonly known as: ADVIL Take 1 tablet (800 mg total) by mouth every 8 (eight) hours as needed.   IRON PO Take 64 mg by mouth daily.   NexIUM 20 MG packet Generic drug: esomeprazole Take 20 mg by mouth daily.   oxyCODONE 5 MG immediate release tablet Commonly known as: Oxy IR/ROXICODONE Take 1-2 tablets (5-10 mg total) by mouth every 4 (four) hours as needed for up to 7 days for moderate pain or severe pain.   PRENATAL VITAMIN PO Take 1 tablet by mouth daily.   UNISOM PO Take 25 mg by mouth daily as needed (nausea).       Diet: routine diet  Activity: Advance as  tolerated. Pelvic rest for 6 weeks.   Outpatient follow up:05/28/2019 for removal of Prevena negative pressure dressing  Follow up Appt:No future appointments. Follow up Visit:No follow-ups on file.  Postpartum contraception: Abstinence  Newborn Data:   Kiylee, Thoreson [[id number]]  Live born female  Birth Weight: 2 lb 10.7 oz (1210 g) APGAR: 8, 9  Newborn Delivery   Birth date/time: 05/20/2019 17:10:00 Delivery type: C-Section, Low Transverse Trial of labor: No C-section categorization: Repeat       Gayathri, Futrell [[id number]]  Live born female  Birth Weight: 3 lb 15.5 oz (1800 g) APGAR: 5, 9  Newborn Delivery   Birth date/time: 05/20/2019 17:12:00 Delivery type: C-Section, Low Transverse Trial of labor: No C-section categorization: Repeat      Baby Feeding: Bottle and Breast Disposition:NICU   05/23/2019 Christophe Louis, MD

## 2019-05-23 NOTE — Lactation Note (Signed)
This note was copied from a baby's chart. Lactation Consultation Note  Patient Name: Jillian Ruiz [id number] Date: 05/23/2019   Mom was finishing pumping when I entered room. Her milk volume is increasing. She was able to pump 20 mL this time. I noted that she was using size 27 flanges instead of the size 24 flanges from yesterday. Mom had not realized that she was using a different flange size & said she could not tell a difference between the two. I suggested that Mom try the two flange sizes and decide which she felt were most comfortable for her.  Mom had no questions about pumping. She knows that she can ask her NICU nurse to call Lactation if she would like to see [place] while she visits her twins in the NICU. Mom also acknowledges receiving our breastfeeding brochures, which would have our contact info for post-discharge questions.   I discussed the CDC recommendation of sanitizing pump parts at least once/day.   Lurline Hare [facility] 05/23/2019, 8:57 AM

## 2019-05-28 ENCOUNTER — Ambulatory Visit: Payer: Self-pay

## 2019-05-28 NOTE — Lactation Note (Signed)
This note was copied from a baby's chart. Lactation Consultation Note  Patient Name: Jillian Ruiz Today's Date: 05/28/2019   Telephone call from RN.  Mom left her pump pieces here at the hospital hooked to the pump and they are now gone. Let her know mom would be charged for a new kit.  However, one had been opened that was not used.  LC gave mom that kit with instructions to wash everything with soap and water prior to use and air dry. Mom reports getting 4 oz on one side and one and a half ounces on the other side usually with pumping.  Urged her to really add massage and hand expression to pumping. Pump 8-12 times day.  May ned to pump up to 30 minutes. Urged mom to call lactation as needed.    Maternal Data    Feeding Feeding Type: Breast Milk  LATCH Score                   Interventions    Lactation Tools Discussed/Used     Consult Status      Alizay Bronkema Thompson Caul 05/28/2019, 9:10 PM

## 2019-06-03 ENCOUNTER — Ambulatory Visit: Payer: Self-pay

## 2019-06-03 NOTE — Lactation Note (Addendum)
This note was copied from a baby's chart. Lactation Consultation Note  Patient Name: Marietta Sikkema [id number] Date: 06/03/2019 Reason for consult: Follow-up assessment;NICU baby;Multiple gestation;Late-preterm 34-36.6wks;Infant < 6lbs  LC in to assist with positioning and latching babies to the breast.  Babies 9 weeks old and AGA [redacted]w[redacted]d.  Baby A girl 3.37 lbs Baby B boy is 4.39 lbs  Mom holding baby girl who was showing some cues.  Assisted with positioning and latching baby on left breast in football hold, initiated a 20 mm nipple shield (showing Mom how to properly turn inside out prior to applying) and nipple pulled half way into shield.  Baby needed some encouragement to open her mouth wide enough to latch.  Baby did root and latch to base of nipple.  Baby sucked with occasional jaw extensions.  Mom taught to use alternate breast compression to increase milk transfer.  Baby sucked with occasional swallow identified for 10 mins before tiring.  Milk noted in shield when baby taken off breast.  Baby boy cueing and spitting pacifier out while waiting in his isolette.  Placed baby on same breast (left) with 20 mm nipple shield.  Baby latched on nipple shield, but didn't suckle for about 4 minutes, then he started becoming more vigorous.  Pulled gently on his chin and opened his mouth wider on areola.  Baby able to sustain suck/swallow for about 12 minutes.  Mom was thrilled as she felt a good tug.  Baby taken off when he tired.    Mom encouraged to continue to support her milk supply with her regular pumping.    Mom encouraged to offer breast when babies are cueing, using nipple shield as a tool to help with latching until they are bigger and more developmentally mature.   Mom will call prn for assistance, LC will follow-up next week.  Interventions Interventions: Breast feeding basics reviewed;Assisted with latch;Skin to skin;Breast massage;Breast compression;Adjust position;Support  pillows;Position options;Expressed milk;DEBP  Lactation Tools Discussed/Used Tools: Nipple Dorris Carnes;Pump Nipple shield size: 20 Breast pump type: Double-Electric Breast Pump   Consult Status Consult Status: Follow-up Date: 06/10/19 Follow-up type: In-patient    [name] 06/03/2019, 6:05 PM

## 2019-06-05 ENCOUNTER — Ambulatory Visit: Payer: Self-pay

## 2019-06-05 NOTE — Lactation Note (Signed)
This note was copied from a baby's chart. Lactation Consultation Note  Patient Name: Jillian Ruiz Today's Date: 06/05/2019   LC attempted to visit with Mom.  Mom had just left the NICU to go home.   Babies are both going to the breast as they are in the 72 hr protected breastfeeding.  Mom is unable to be in the NICU for this due to other children at home.  Spoke with RN.  RN said that Mom is doing very well with latching babies. Latch scores of 9    Will follow-up another day.   [name] 06/05/2019, 1:43 PM

## 2019-06-08 ENCOUNTER — Ambulatory Visit: Payer: Self-pay

## 2019-06-08 NOTE — Lactation Note (Addendum)
This note was copied from a baby's chart. Lactation Consultation Note  Patient Name: Jillian Ruiz [id number] Date: 06/08/2019 Reason for consult: Follow-up assessment;NICU baby;Infant < 6lbs;Multiple gestation   Goal:  Mother would like to provide twins with as much breastfeeding experiences as possible depending on her ability to be here and plans to continue to pump regularly.  Will supplement once home with formula only if needed.   P4, Twins CGA [redacted]w[redacted]d.  Observed mother prepumping session  for approx 10 min prior to latch attempt.  Pumping comfortable using #24 flanges.  Provided mother with additional labels, bottles and another #20NS.  Mother states she pumps 7-10 oz per 20 min session.  Baby A Girl "Jelly" demonstrated feeding cues prior to latch, rooting, hands to mouth. Mother applied #20NS and latched baby in football hold on L side with intermittent sucking bursts and swallows.  Initial rapid sucks and then slower sucks with pauses, gradually tiring. Infant was well supported, tucked against mother's body. Breastmilk in nipple shield after feeding.  Baby breastfed for 12 min before tiring.  Baby B Boy "TJ" also demonstrating frequent feedings cues, rooting, hands to mouth,  pushing pacifier out of mouth to root.   Mother applied #20 NS and latched in football hold on R side.  Intermittent swallows heard. Infant well supported, tucked against mother's body.  Baby breastfed for 17 min.  Mother confident in handling infants and comfortable with breastfeeding.  Lactation to follow up as needed and will check on family later this week.        Maternal Data    Feeding Feeding Type: Breast Fed  LATCH Score Latch: Grasps breast easily, tongue down, lips flanged, rhythmical sucking.  Audible Swallowing: A few with stimulation  Type of Nipple: Everted at rest and after stimulation  Comfort (Breast/Nipple): Soft / non-tender  Hold (Positioning): No assistance needed  to correctly position infant at breast.  LATCH Score: 9  Interventions Interventions: Skin to skin;DEBP  Lactation Tools Discussed/Used Tools: Nipple Dorris Carnes;Pump Nipple shield size: 20 Breast pump type: Double-Electric Breast Pump   Consult Status Consult Status: Follow-up Date: 06/11/19 Follow-up type: In-patient    Dahlia Byes [facility] 06/08/2019, 11:14 AM

## 2019-06-20 ENCOUNTER — Ambulatory Visit: Payer: Self-pay

## 2019-06-20 NOTE — Lactation Note (Signed)
This note was copied from a baby's chart. Lactation Consultation Note  Patient Name: Jillian Ruiz NTIRW'E Date: 06/20/2019 Reason for consult: Follow-up assessment;NICU baby;Multiple gestation  1426 - 1431 - I followed up with Ms. Jillian Ruiz as I was rounding today. She was holding baby "Jillian Ruiz" in her arms upon entry. Baby had recently finished a bottle. She reports that she is doing well with pumping. Her other twin is currently at home. Ms. Brick states that she is pumping every three hours, and she routinely pumps approximately 7-10 ounces/pump. She states that she is producing enough milk to provide exclusive breast milk to her twins as well as store some excess milk.  She is using a Medela portable pump at home (freemie?). She had no questions or concerns or lactation consultant needs at this time. She states that her feeding plan is continue pumping and bottle feeding.  All questions answered at this time. I offered to put Jillian Ruiz down as PRN for follow up and encouraged her to call lactation if she had any changes or concerns. She verbalized understanding.   Maternal Data Formula Feeding for Exclusion: No Does the patient have breastfeeding experience prior to this delivery?: Yes  Feeding Feeding Type: Breast Milk with Formula added Nipple Type: Dr. Levert Feinstein Preemie   Interventions Interventions: Breast feeding basics reviewed  Lactation Tools Discussed/Used Pump Review: Setup, frequency, and cleaning   Consult Status Consult Status: PRN    Walker Shadow 06/20/2019, 2:33 PM

## 2019-07-02 DIAGNOSIS — Z4889 Encounter for other specified surgical aftercare: Secondary | ICD-10-CM | POA: Diagnosis not present

## 2019-07-02 DIAGNOSIS — Z98891 History of uterine scar from previous surgery: Secondary | ICD-10-CM | POA: Diagnosis not present

## 2020-03-28 DIAGNOSIS — Z6841 Body Mass Index (BMI) 40.0 and over, adult: Secondary | ICD-10-CM | POA: Diagnosis not present

## 2020-03-28 DIAGNOSIS — Z113 Encounter for screening for infections with a predominantly sexual mode of transmission: Secondary | ICD-10-CM | POA: Diagnosis not present

## 2020-03-28 DIAGNOSIS — F32A Depression, unspecified: Secondary | ICD-10-CM | POA: Diagnosis not present

## 2020-03-28 DIAGNOSIS — Z01419 Encounter for gynecological examination (general) (routine) without abnormal findings: Secondary | ICD-10-CM | POA: Diagnosis not present

## 2020-03-29 IMAGING — US US MFM UA CORD DOPPLER
1 series · 13 of 28 positions shown · non-contrast
Comparison: none

[Series 1: us mfm ua cord doppler · 13 of 51 slices shown]
[im 2/51]
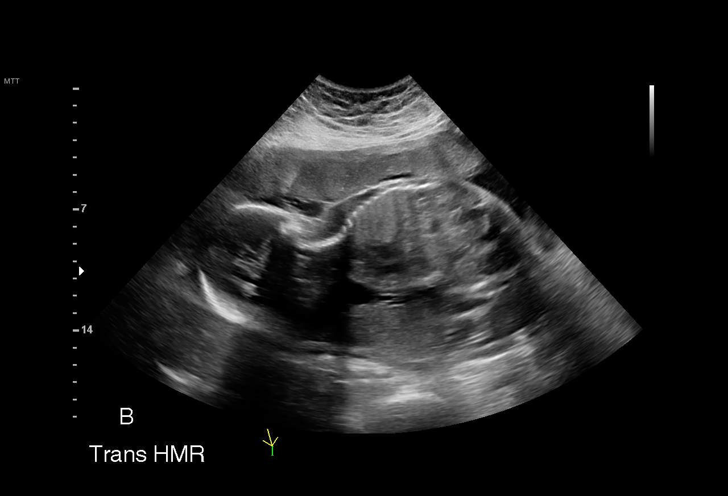
[im 6/51]
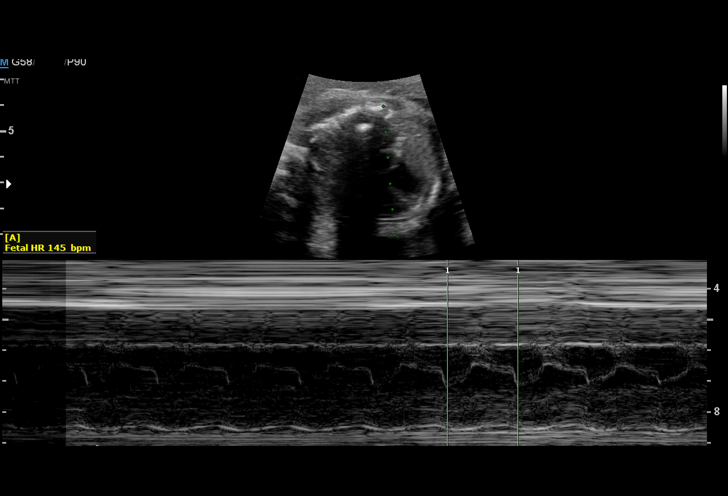
[im 10/51]
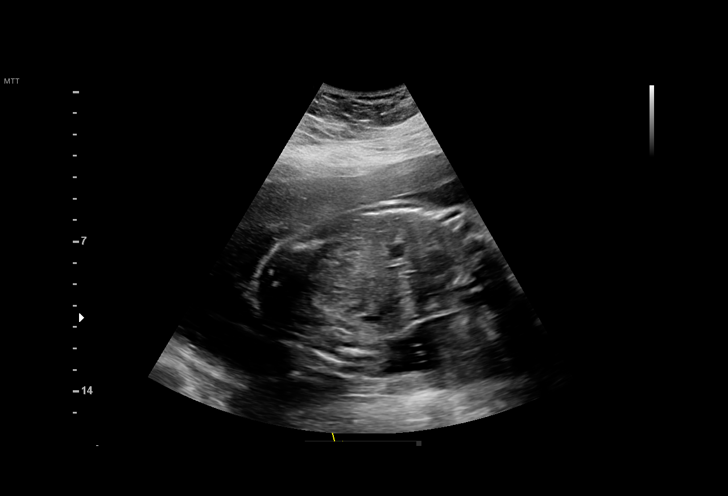
[im 13/51]
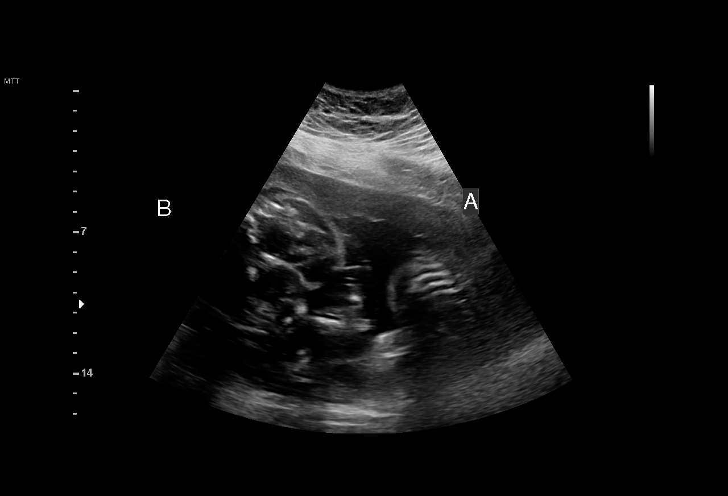
[im 17/51]
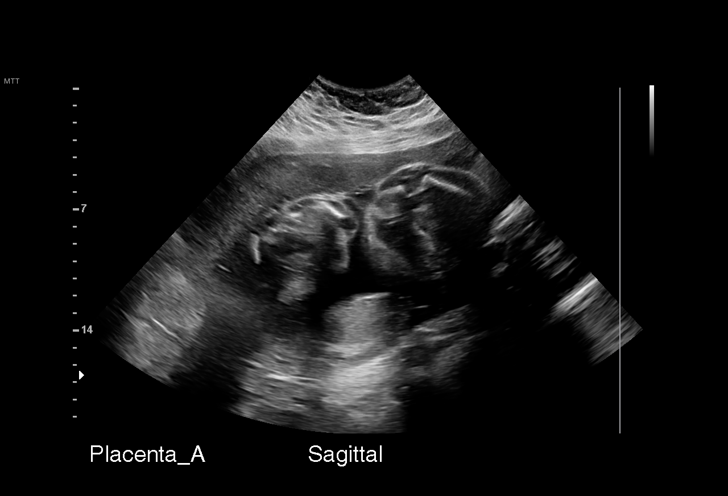
[im 21/51]
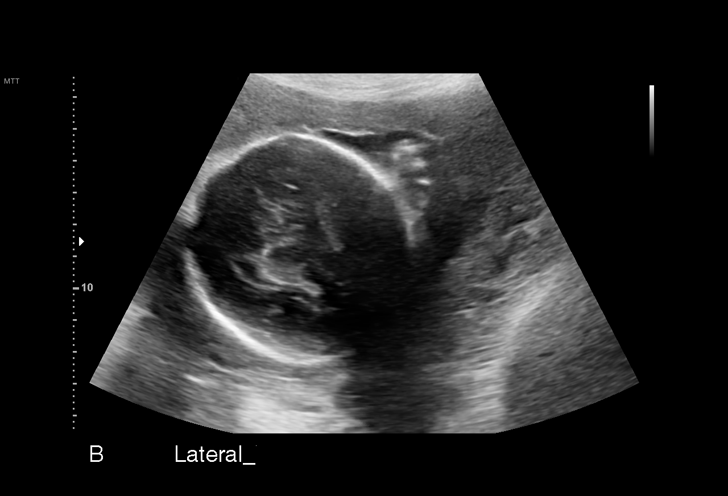
[im 26/51]
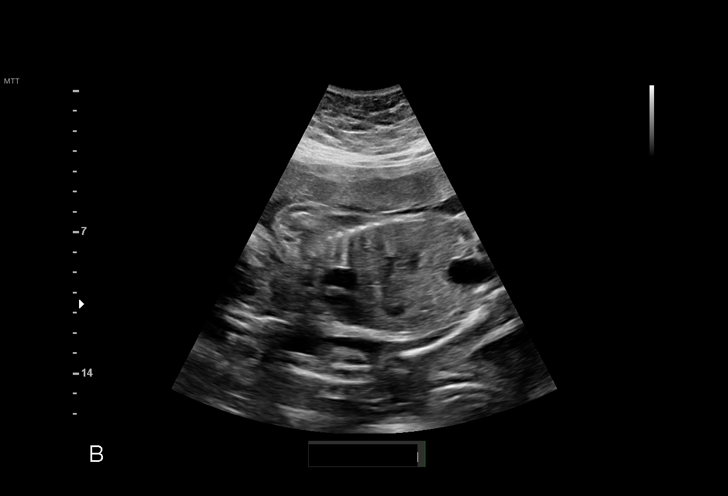
[im 30/51]
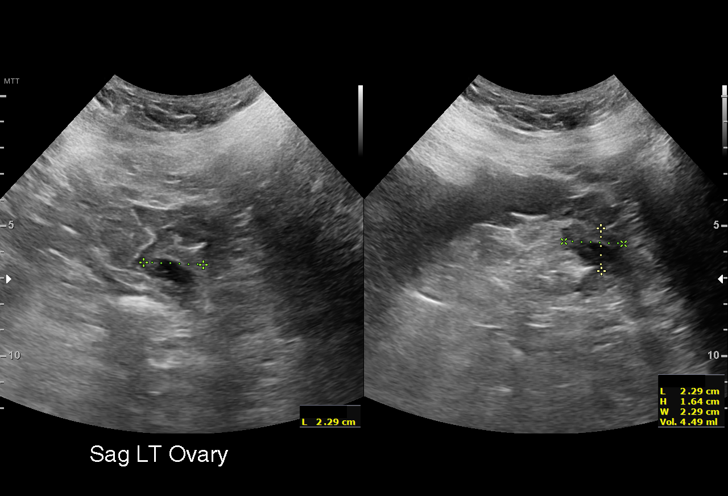
[im 34/51]
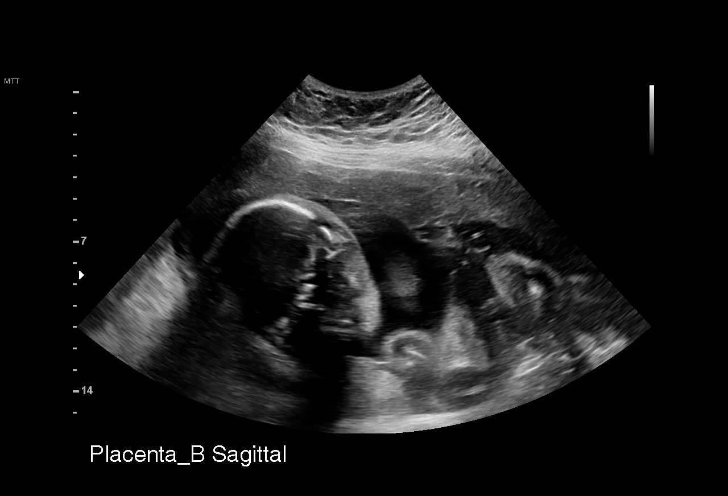
[im 38/51]
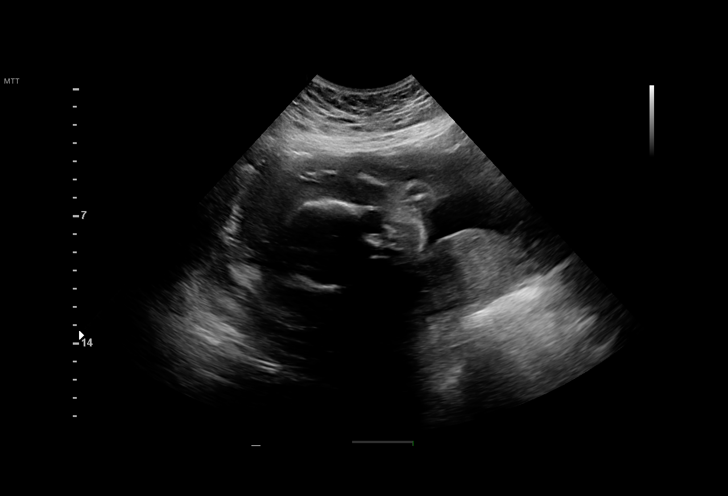
[im 41/51]
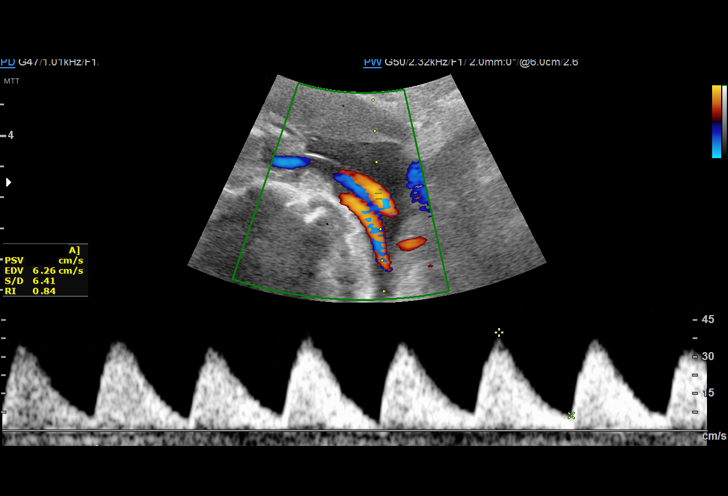
[im 45/51]
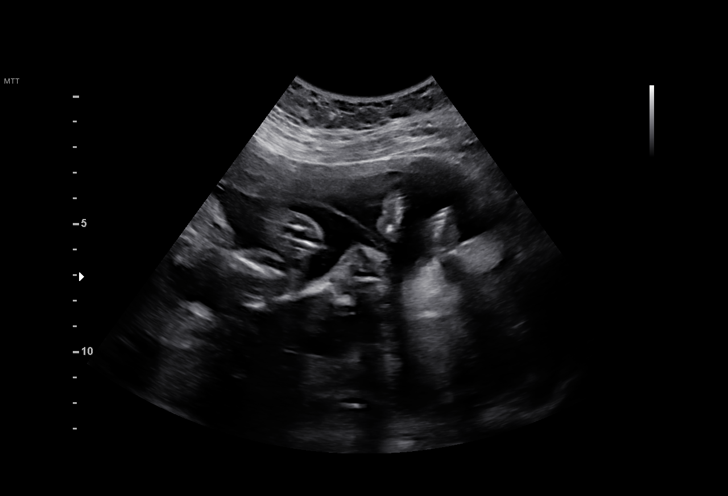
[im 49/51]
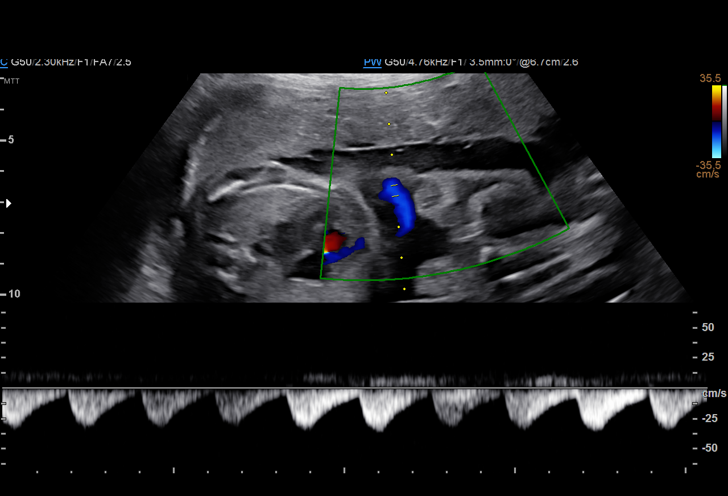

[13 of 28 positions shown; findings below may reference images not displayed]

[REDACTED]
                                                            Ave., [HOSPITAL]

     GEST RE EVAL
 ----------------------------------------------------------------------

 ----------------------------------------------------------------------
Indications

  Maternal care for known or suspected poor
  fetal growth, second trimester, fetus 1 IUGR
  26 weeks gestation of pregnancy
  Twin pregnancy, di/di, second trimester
  Abnormal fetal ultrasound (discordant growth
  in office)
  Previous cesarean delivery, antepartum x2
  Encounter for antenatal screening for
  malformations
 ----------------------------------------------------------------------
Fetal Evaluation (Fetus A)

 Num Of Fetuses:         2
 Fetal Heart Rate(bpm):  145
 Cardiac Activity:       Observed
 Fetal Lie:              Lower Fetus
 Presentation:           Cephalic
 Placenta:               Posterior
 P. Cord Insertion:      Previously Visualized

 Amniotic Fluid
 AFI FV:      Within normal limits

                             Largest Pocket(cm)

OB History
 Gravidity:    3         Term:   0        Prem:   0        SAB:   0
 TOP:          0       Ectopic:  0        Living: 2
Gestational Age (Fetus A)

 LMP:           27w 4d        Date:  09/15/18                 EDD:   06/22/19
 Best:          26w 2d     Det. By:  Early Ultrasound         EDD:   07/01/19
Anatomy (Fetus A)

 Ventricles:            Appears normal         Kidneys:                Appear normal
 Diaphragm:             Appears normal         Bladder:                Appears normal
 Stomach:               Appears normal, left
                        sided
Doppler - Fetal Vessels (Fetus A)

 Umbilical Artery
  S/D     %tile                                            ADFV    RDFV
 7.95    > 97.5                                               No      No

Fetal Evaluation (Fetus B)

 Num Of Fetuses:         2
 Fetal Heart Rate(bpm):  146
 Cardiac Activity:       Observed
 Fetal Lie:              Upper Fetus
 Presentation:           Transverse, head to maternal right
 Placenta:               Anterior
 P. Cord Insertion:      Previously Visualized

 Amniotic Fluid
 AFI FV:      Within normal limits

                             Largest Pocket(cm)

Gestational Age (Fetus B)

 LMP:           27w 4d        Date:  09/15/18                 EDD:   06/22/19
 Best:          26w 2d     Det. By:  Early Ultrasound         EDD:   07/01/19
Anatomy (Fetus B)

 Ventricles:            Appears normal         Kidneys:                Appear normal
 Diaphragm:             Appears normal         Bladder:                Appears normal
 Stomach:               Appears normal, left
                        sided
Doppler - Fetal Vessels (Fetus B)

 Umbilical Artery
  S/D     %tile                                            ADFV    RDFV
 4.35       91                                                No      No
Cervix Uterus Adnexa

 Cervix
 Not visualized (advanced GA >44wks)

 Uterus
 No abnormality visualized.

 Left Ovary
 Within normal limits. No adnexal mass visualized.

 Right Ovary
 Within normal limits. No adnexal mass visualized.

 Cul De Sac
 No free fluid seen.

 Adnexa
 No abnormality visualized.
Comments

 This patient was seen for umbilical artery Doppler studies due
 to IUGR of twin A.  She denies any problems since her last
 exam and reports feeling vigorous fetal movements
 throughout the day.

 There was normal amniotic fluid noted around twin A and twin
 B.

 Doppler studies of the umbilical arteries performed for twin A
 showed an elevated S/D ratio of 7.95.  There continues to be
 forward flow and there were no signs of absent or reversed
 end-diastolic flow.

 Doppler studies of the umbilical arteries performed for twin B
 shows a slightly elevated S/D ratio of 4.35.  There were no
 signs of absent or reversed end-diastolic flow.

 Vigorous fetal movements were noted in both twin A and twin
 B during today's ultrasound exam.

 The patient was advised that we will continue to follow her
 with weekly umbilical artery Doppler studies.  She
 understands that an earlier delivery may be indicated should
 there be further worsening of her umbilical artery Doppler
 studies.

## 2020-05-07 IMAGING — US US MFM UA ADDL GEST RE-EVAL
1 series · 14 of 28 positions shown · non-contrast
Comparison: none

[Series 1: us mfm ua addl gest re-eval · 45 acquisitions, 14 frames shown]
[im 2/45]
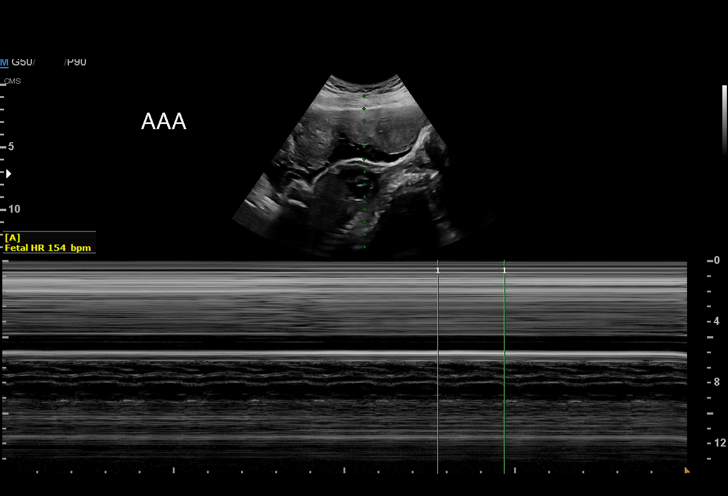
[im 5/45]
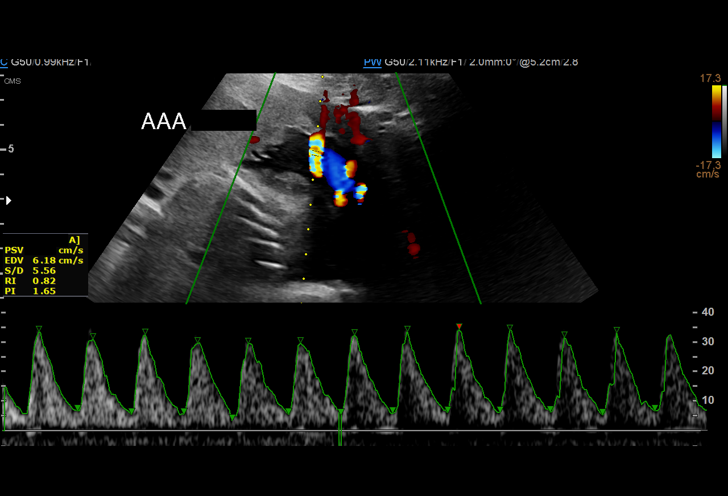
[im 9/45]
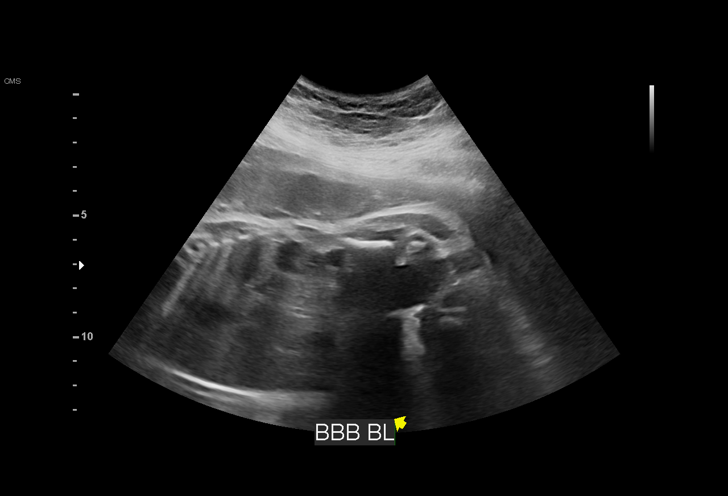
[im 12/45]
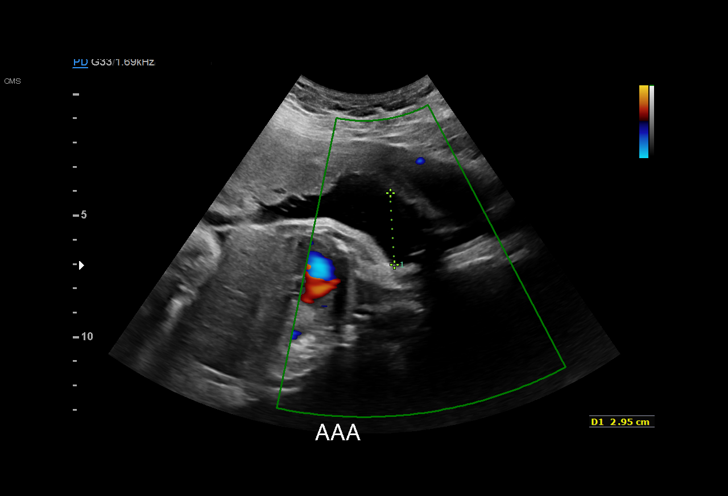
[im 15/45]
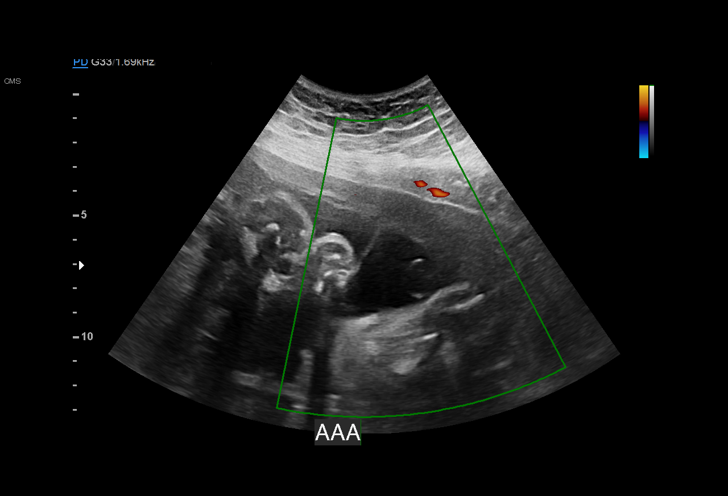
[im 18/45]
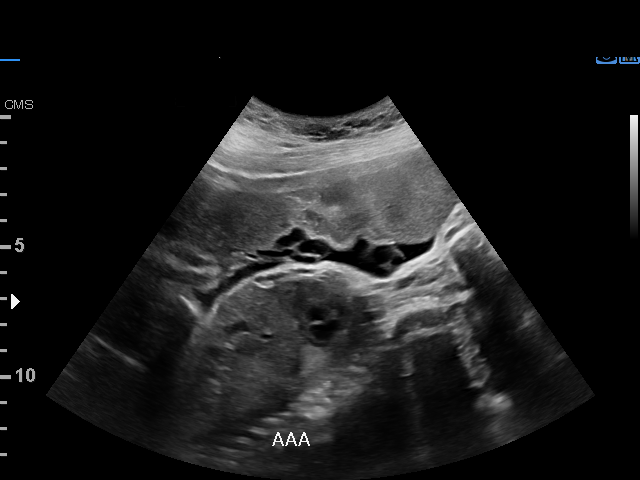
[im 22/45]
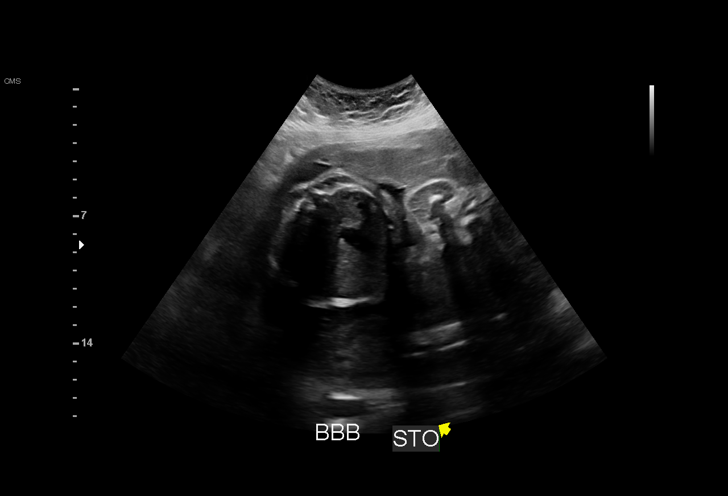
[im 25/45]
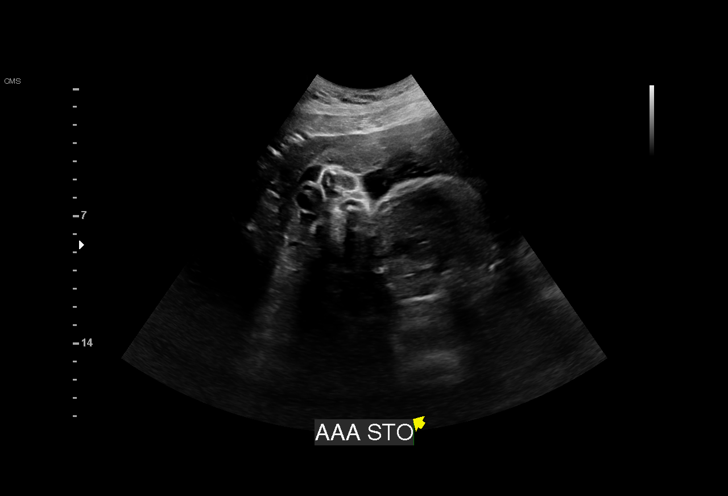
[im 28/45]
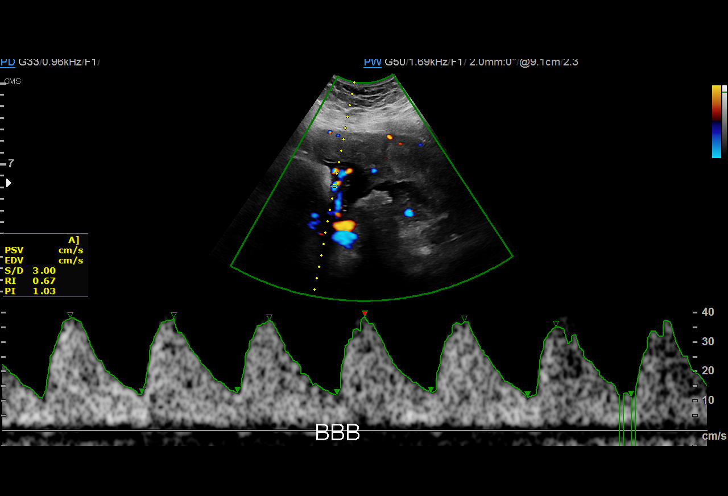
[im 31/45]
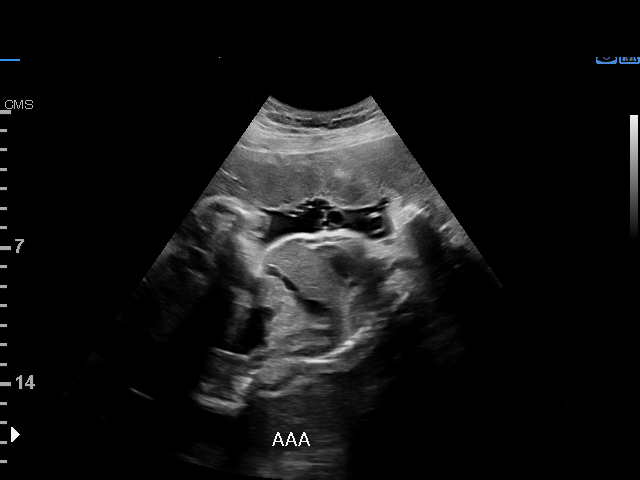
[im 35/45]
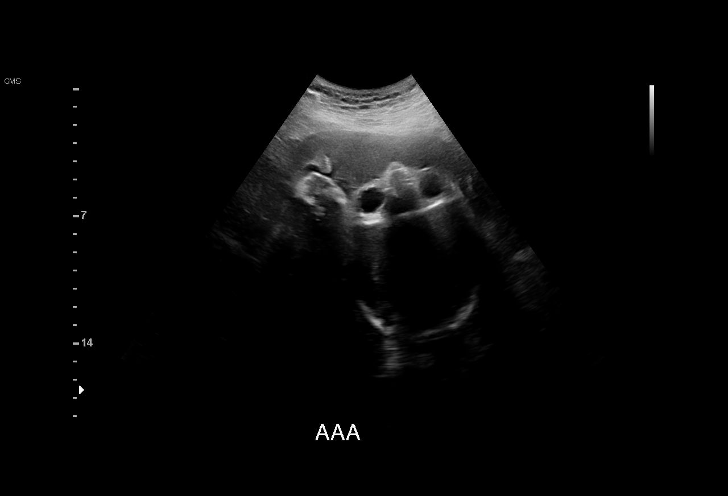
[im 38/45]
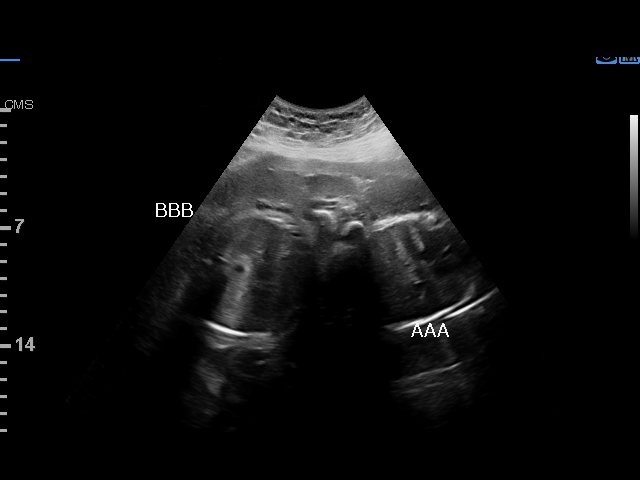
[im 41/45]
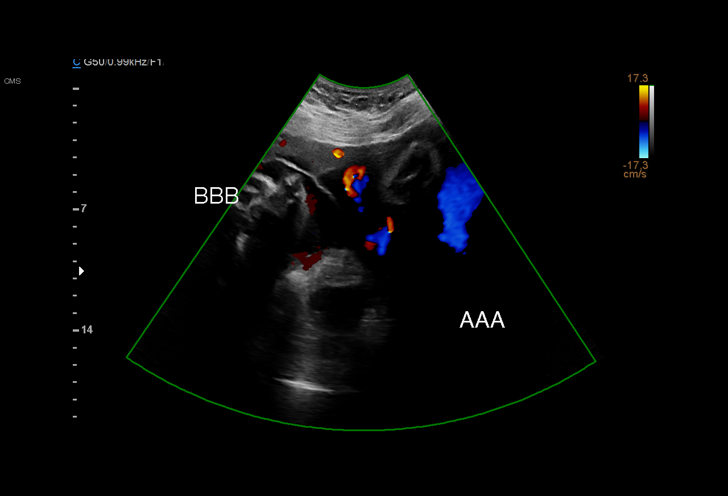
[im 45/45]
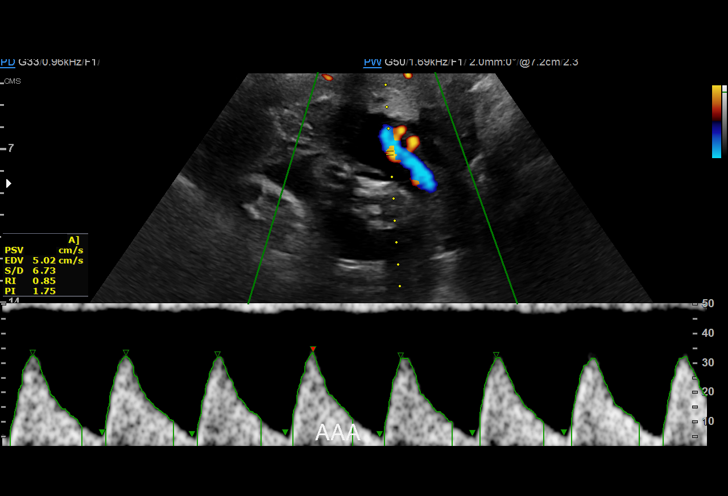

[14 of 28 positions shown; findings below may reference images not displayed]

[REDACTED]
                                                            Ave., [HOSPITAL]

     GEST RE EVAL
 ----------------------------------------------------------------------

 ----------------------------------------------------------------------
Indications

  31 weeks gestation of pregnancy
  Maternal care for known or suspected poor
  fetal growth, third trimester, fetus 1 IUGR
  Twin pregnancy, di/di, third trimester
  Abnormal fetal ultrasound (discordant growth
  in office)
  Previous cesarean delivery, antepartum x2
  Maternal morbid obesity
  Encounter for other antenatal screening
  follow-up
 ----------------------------------------------------------------------
Fetal Evaluation (Fetus A)

 Num Of Fetuses:         2
 Fetal Heart Rate(bpm):  154
 Cardiac Activity:       Observed
 Fetal Lie:              Lower Fetus
 Presentation:           Cephalic
 Membrane Desc:      Dividing Membrane seen

 Amniotic Fluid
 AFI FV:      Within normal limits

                             Largest Pocket(cm)

OB History
 Gravidity:    3         Term:   0        Prem:   0        SAB:   0
 TOP:          0       Ectopic:  0        Living: 2
Gestational Age (Fetus A)

 LMP:           33w 1d        Date:  09/15/18                 EDD:   06/22/19
 Best:          31w 6d     Det. By:  Early Ultrasound         EDD:   07/01/19
Doppler - Fetal Vessels (Fetus A)

 Umbilical Artery
  S/D     %tile     RI              PI                     ADFV    RDFV
 5.16    > 97.5  0.81             1.54                       Yes      No

Fetal Evaluation (Fetus B)

 Num Of Fetuses:         2
 Fetal Heart Rate(bpm):  130
 Cardiac Activity:       Observed
 Fetal Lie:              Upper Fetus
 Presentation:           Transverse, head to maternal right
 Membrane Desc:      Dividing Membrane seen

 Amniotic Fluid
 AFI FV:      Within normal limits

                             Largest Pocket(cm)

Gestational Age (Fetus B)

 LMP:           33w 1d        Date:  09/15/18                 EDD:   06/22/19
 Best:          31w 6d     Det. By:  Early Ultrasound         EDD:   07/01/19
Doppler - Fetal Vessels (Fetus B)

 Umbilical Artery
  S/D     %tile                                            ADFV    RDFV
 3.33       82                                                No      No

Cervix Uterus Adnexa

 Cervix
 Not visualized (advanced GA >56wks)
Impression

 Follow up Twin Fetal surveillance for IUGR with abnormal UA
 Dopplers
 Twin A shows intermittenet AEDF again, with present
 stomach and bladder. However, the UA Dopplers are mostly
 elevated.
 Twin B has normal fluid and UA Dopplers.
 There is good fetal movement and observed breathing for
 both.

 I discussed today's findings she expresses good fetal
 movement.
Recommendations

 Continue 2x weekly testing
 Plan for delivery by 34 weeks.

## 2021-03-30 ENCOUNTER — Other Ambulatory Visit (HOSPITAL_COMMUNITY)
Admission: RE | Admit: 2021-03-30 | Discharge: 2021-03-30 | Disposition: A | Discharge status: Home / Self Care | Payer: Medicaid Other | Source: Ambulatory Visit | Attending: Obstetrics and Gynecology | Admitting: Obstetrics and Gynecology

## 2021-03-30 DIAGNOSIS — Z113 Encounter for screening for infections with a predominantly sexual mode of transmission: Secondary | ICD-10-CM | POA: Diagnosis not present

## 2021-03-30 DIAGNOSIS — Z0000 Encounter for general adult medical examination without abnormal findings: Secondary | ICD-10-CM | POA: Diagnosis not present

## 2021-03-30 DIAGNOSIS — Z01419 Encounter for gynecological examination (general) (routine) without abnormal findings: Secondary | ICD-10-CM | POA: Insufficient documentation

## 2021-04-03 LAB — CYTOLOGY - PAP
Adequacy: ABSENT
Comment: NEGATIVE
Diagnosis: NEGATIVE
High risk HPV: NEGATIVE
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
## 2021-08-11 ENCOUNTER — Ambulatory Visit: Payer: Medicaid Other | Admitting: Family Medicine

## 2021-08-11 ENCOUNTER — Encounter: Payer: Self-pay | Admitting: Family Medicine

## 2021-08-11 ENCOUNTER — Ambulatory Visit (INDEPENDENT_AMBULATORY_CARE_PROVIDER_SITE_OTHER): Payer: Medicaid Other

## 2021-08-11 ENCOUNTER — Other Ambulatory Visit: Payer: Self-pay | Admitting: Family Medicine

## 2021-08-11 DIAGNOSIS — M25572 Pain in left ankle and joints of left foot: Secondary | ICD-10-CM

## 2021-08-11 DIAGNOSIS — Z09 Encounter for follow-up examination after completed treatment for conditions other than malignant neoplasm: Secondary | ICD-10-CM

## 2021-08-11 DIAGNOSIS — G8929 Other chronic pain: Secondary | ICD-10-CM | POA: Diagnosis not present

## 2021-08-11 DIAGNOSIS — M25562 Pain in left knee: Secondary | ICD-10-CM

## 2021-08-11 DIAGNOSIS — R52 Pain, unspecified: Secondary | ICD-10-CM

## 2021-08-12 LAB — COMPLETE METABOLIC PANEL WITH GFR
AG Ratio: 1.3 (calc) (ref 1.0–2.5)
ALT: 9 U/L (ref 6–29)
AST: 10 U/L (ref 10–30)
Albumin: 4.5 g/dL (ref 3.6–5.1)
Alkaline phosphatase (APISO): 60 U/L (ref 31–125)
BUN: 10 mg/dL (ref 7–25)
CO2: 27 mmol/L (ref 20–32)
Calcium: 9.4 mg/dL (ref 8.6–10.2)
Chloride: 103 mmol/L (ref 98–110)
Creat: 0.69 mg/dL (ref 0.50–0.97)
Globulin: 3.5 g/dL (calc) (ref 1.9–3.7)
Glucose, Bld: 78 mg/dL (ref 65–139)
Potassium: 4.2 mmol/L (ref 3.5–5.3)
Sodium: 138 mmol/L (ref 135–146)
Total Bilirubin: 0.3 mg/dL (ref 0.2–1.2)
Total Protein: 8 g/dL (ref 6.1–8.1)
eGFR: 117 mL/min/{1.73_m2} (ref >=60)

## 2021-08-12 LAB — CBC
HCT: 35.5 % (ref 35.0–45.0)
Hemoglobin: 11.6 g/dL — ABNORMAL LOW (ref 11.7–15.5)
MCH: 27 pg (ref 27.0–33.0)
MCHC: 32.7 g/dL (ref 32.0–36.0)
MCV: 82.8 fL (ref 80.0–100.0)
MPV: 11.3 fL (ref 7.5–12.5)
Platelets: 278 10*3/uL (ref 140–400)
RBC: 4.29 10*6/uL (ref 3.80–5.10)
RDW: 12.8 % (ref 11.0–15.0)
WBC: 6.2 10*3/uL (ref 3.8–10.8)

## 2021-08-12 LAB — LIPID PANEL
Cholesterol: 168 mg/dL (ref <200)
HDL: 50 mg/dL (ref >=50)
LDL Cholesterol (Calc): 104 mg/dL (calc) — ABNORMAL HIGH
Non-HDL Cholesterol (Calc): 118 mg/dL (calc) (ref <130)
Total CHOL/HDL Ratio: 3.4 (calc) (ref <5.0)
Triglycerides: 56 mg/dL (ref <150)

## 2021-08-12 LAB — HEMOGLOBIN A1C
Hgb A1c MFr Bld: 5.4 % of total Hgb (ref <5.7)
Mean Plasma Glucose: 108 mg/dL
eAG (mmol/L): 6 mmol/L

## 2021-08-12 LAB — TSH+FREE T4: TSH W/REFLEX TO FT4: 1.53 mIU/L

## 2021-08-13 ENCOUNTER — Other Ambulatory Visit: Payer: Self-pay | Admitting: Family Medicine

## 2021-08-13 MED ORDER — RYBELSUS 3 MG PO TABS: 1.0000 | ORAL_TABLET | Freq: Every day | ORAL | 3 refills | Status: DC

## 2021-08-14 ENCOUNTER — Other Ambulatory Visit: Payer: Self-pay | Admitting: Family Medicine

## 2021-08-15 ENCOUNTER — Telehealth: Payer: Self-pay | Admitting: Family Medicine

## 2021-08-15 NOTE — Telephone Encounter (Signed)
Rybelsus in non-preferred on Medicaid formulary.  Preferred GLP-1 are bydureon, byetta, trulicity, victoza, ozempic.  No medications for weight management are covered.

## 2021-08-15 NOTE — Telephone Encounter (Signed)
Pt called. She is Dr. Tamera Punt patient.  She is unable to get her Rybelsus from her pharmacy due to Dr Tamera Punt  credentialing not completed with some insurance companie..per Toniann Fail.

## 2021-08-16 ENCOUNTER — Encounter: Payer: Self-pay | Admitting: Physical Therapy

## 2021-08-16 ENCOUNTER — Ambulatory Visit: Payer: Medicaid Other | Attending: Family Medicine | Admitting: Physical Therapy

## 2021-08-16 DIAGNOSIS — M6281 Muscle weakness (generalized): Secondary | ICD-10-CM | POA: Diagnosis not present

## 2021-08-16 DIAGNOSIS — M25561 Pain in right knee: Secondary | ICD-10-CM | POA: Insufficient documentation
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
## 2021-08-21 ENCOUNTER — Other Ambulatory Visit: Payer: Self-pay

## 2021-08-21 MED ORDER — RYBELSUS 3 MG PO TABS: 1.0000 | ORAL_TABLET | Freq: Every day | ORAL | 3 refills | Status: DC

## 2021-08-23 ENCOUNTER — Ambulatory Visit: Payer: Medicaid Other | Admitting: Physical Therapy

## 2021-09-04 NOTE — Progress Notes (Unsigned)
   Annual Wellness Visit     Patient: Jillian Ruiz, Female    DOB: 05-Dec-1990, 31 y.o.   MRN: [id number]  Subjective  No chief complaint on file.   Jillian Ruiz is a 31 y.o. female who presents today for her Annual Wellness Visit. She reports consuming a {diet types:17450} diet. {Exercise:19826} She generally feels {well/fairly well/poorly:18703}. She reports sleeping {well/fairly well/poorly:18703}. She {does/does not:200015} have additional problems to discuss today.   HPI  {VISON DENTAL STD PSA (Optional):27386}   {History (Optional):23778}  Medications: Outpatient Medications Prior to Visit  Medication Sig   Semaglutide (RYBELSUS) 3 MG TABS Take 1 tablet by mouth daily.   No facility-administered medications prior to visit.    Allergies  Allergen Reactions   Apple Juice    Fruit & Vegetable Daily [Nutritional Supplements]     With skin on it( fresh fruit)   Peach [Prunus Persica]    Pear    Pineapple     Patient Care Team: Charlton Amor, DO as PCP - General (Family Medicine)  ROS      Objective  There were no vitals taken for this visit. {Vitals History (Optional):23777}  Physical Exam    Most recent functional status assessment:     No data to display         Most recent fall risk assessment:    08/11/2021    2:29 PM  Fall Risk   Falls in the past year? 1  Number falls in past yr: 0  Injury with Fall? 0    Most recent depression screenings:    08/11/2021    2:36 PM 08/11/2021    2:31 PM  PHQ 2/9 Scores  PHQ - 2 Score 0 0  PHQ- 9 Score 4    Most recent cognitive screening:     No data to display         Most recent Audit-C alcohol use screening     No data to display         A score of 3 or more in women, and 4 or more in men indicates increased risk for alcohol abuse, EXCEPT if all of the points are from question 1   Vision/Hearing Screen: No results found.  {Labs (Optional):23779}  No results found for any visits  on 09/05/21.    Assessment & Plan   Annual wellness visit done today including the all of the following: Reviewed patient's Family Medical History Reviewed and updated list of patient's medical providers Assessment of cognitive impairment was done Assessed patient's functional ability Established a written schedule for health screening services Health Risk Assessent Completed and Reviewed  Exercise Activities and Dietary recommendations  Goals   None      There is no immunization history on file for this patient.  Health Maintenance  Topic Date Due   COVID-19 Vaccine (1) Never done   Hepatitis C Screening  Never done   INFLUENZA VACCINE  08/15/2021   PAP SMEAR-Modifier  03/30/2024   TETANUS/TDAP  04/15/2029   HIV Screening  Completed   HPV VACCINES  Aged Out     Discussed health benefits of physical activity, and encouraged her to engage in regular exercise appropriate for her age and condition.    Problem List Items Addressed This Visit   None   No follow-ups on file.     Charlton Amor, DO

## 2021-09-05 ENCOUNTER — Encounter: Payer: Self-pay | Admitting: Family Medicine

## 2021-09-05 ENCOUNTER — Ambulatory Visit (INDEPENDENT_AMBULATORY_CARE_PROVIDER_SITE_OTHER): Payer: Medicaid Other | Admitting: Family Medicine

## 2021-09-05 VITALS — BP 136/81 | HR 92 | Ht 66.0 in | Wt 259.0 lb

## 2021-09-05 DIAGNOSIS — Z0000 Encounter for general adult medical examination without abnormal findings: Secondary | ICD-10-CM | POA: Diagnosis not present

## 2021-09-05 LAB — TB SKIN TEST

## 2021-09-05 NOTE — Patient Instructions (Signed)
Follow back up Thursday afternoon for a nurse visit for your TB skin test

## 2021-09-05 NOTE — Addendum Note (Signed)
Addended by: Howard Pouch on: 09/05/2021 05:00 PM   Modules accepted: Orders

## 2021-09-05 NOTE — Assessment & Plan Note (Signed)
-   doing well from health standpoint - up to date on screenings  - received TB skin test and will be back Thursday for nurse visit for official read. Once read I will sign and finish form required for her new job

## 2021-09-08 ENCOUNTER — Ambulatory Visit: Payer: Medicaid Other | Admitting: Family Medicine

## 2021-10-02 NOTE — Progress Notes (Unsigned)
     Established patient visit   Patient: Jillian Ruiz   DOB: Jan 10, 1991   31 y.o. Female  MRN: [id number] Visit Date: 10/03/2021  Today's healthcare provider: Owens Loffler, DO   No chief complaint on file.   SUBJECTIVE   No chief complaint on file.  HPI  Pt presents today for weight follow up. Ryblesus was not approved per insurance   [phone]? For weight managment  Review of Systems     No outpatient medications have been marked as taking for the 10/03/21 encounter (Appointment) with Owens Loffler, DO.    OBJECTIVE    There were no vitals taken for this visit.  Physical Exam       ASSESSMENT & PLAN    Problem List Items Addressed This Visit   None   No follow-ups on file.      No orders of the defined types were placed in this encounter.   No orders of the defined types were placed in this encounter.    Owens Loffler, DO  [facility] [phone] (phone) [phone] (fax)  [place]

## 2021-10-03 ENCOUNTER — Encounter: Payer: Self-pay | Admitting: Family Medicine

## 2021-10-03 ENCOUNTER — Ambulatory Visit: Payer: Medicaid Other | Admitting: Family Medicine

## 2021-10-03 DIAGNOSIS — A6000 Herpesviral infection of urogenital system, unspecified: Secondary | ICD-10-CM | POA: Diagnosis not present

## 2021-10-03 MED ORDER — VALACYCLOVIR HCL 1 G PO TABS: 1000.0000 mg | ORAL_TABLET | Freq: Every day | ORAL | 0 refills | Status: AC

## 2021-10-03 MED ORDER — SEMAGLUTIDE 7 MG PO TABS: 1.0000 | ORAL_TABLET | Freq: Every day | ORAL | 2 refills | Status: AC

## 2021-10-03 NOTE — Assessment & Plan Note (Signed)
-   increase rybelsus to 7mg   - pt doing well. Has had 4lb weight loss - discussed diet and exercise

## 2021-10-03 NOTE — Assessment & Plan Note (Signed)
-   pt has hx of herpes and needs meds for flare up. Says they happen with her period lately. Recommended she change pads - sent in valtrex with instructions to take one tab for 5 days with outbreak. Gave 30pills so she can have some on hand

## 2021-10-04 ENCOUNTER — Telehealth: Payer: Self-pay

## 2021-10-04 NOTE — Telephone Encounter (Signed)
Initiated Prior authorization [id number] 3MG  tablets Via: Covermymeds Case/Key:[id number] Status: approved as of 10/04/21 Reason:Coverage Starts on: 09/01/2021 12:00:00 AM, Coverage Ends on: 09/01/2022 Notified Pt via:Pt does not have Mychart

## 2021-12-21 ENCOUNTER — Encounter: Payer: Self-pay | Admitting: Family Medicine

## 2021-12-21 ENCOUNTER — Ambulatory Visit: Payer: Medicaid Other | Admitting: Family Medicine

## 2021-12-21 MED ORDER — RYBELSUS 14 MG PO TABS: 14.0000 mg | ORAL_TABLET | Freq: Every day | ORAL | 3 refills | Status: DC

## 2021-12-21 NOTE — Assessment & Plan Note (Signed)
-   increase rybelsus to 14mg  for weight management - she has been doing well on this.  - she just picked up the 7mg  so we will have her complete her bottle of 7s and then have her switch to the 14s.

## 2021-12-21 NOTE — Progress Notes (Signed)
     Established patient visit   Patient: Jillian Ruiz   DOB: Apr 06, 1990   31 y.o. Female  MRN: [id number] Visit Date: 12/21/2021  Today's healthcare provider: Charlton Amor, DO   Chief Complaint  Patient presents with   Follow-up    SUBJECTIVE    Chief Complaint  Patient presents with   Follow-up   HPI  Pt presents for follow up on rybelsus 7mg  for weight management. She has been doing well and is now down to an XL size. She has been going to the gym and focusing on muscle building as well.   Review of Systems  Constitutional:  Negative for activity change, fatigue and fever.  Respiratory:  Negative for cough and shortness of breath.   Cardiovascular:  Negative for chest pain.  Gastrointestinal:  Negative for abdominal pain.  Genitourinary:  Negative for difficulty urinating.       Current Meds  Medication Sig   Semaglutide (RYBELSUS) 14 MG TABS Take 1 tablet (14 mg total) by mouth daily.   [DISCONTINUED] RYBELSUS 7 MG TABS Take 1 tablet by mouth daily.    OBJECTIVE    BP 119/75   Pulse 80   Ht 5\' 6"  (1.676 m)   Wt 254 lb (115.2 kg)   SpO2 100%   BMI 41.00 kg/m   Physical Exam Vitals and nursing note reviewed.  Constitutional:      General: She is not in acute distress.    Appearance: Normal appearance.  HENT:     Head: Normocephalic and atraumatic.     Right Ear: External ear normal.     Left Ear: External ear normal.     Nose: Nose normal.  Eyes:     Conjunctiva/sclera: Conjunctivae normal.  Cardiovascular:     Rate and Rhythm: Normal rate and regular rhythm.  Pulmonary:     Effort: Pulmonary effort is normal.     Breath sounds: Normal breath sounds.  Neurological:     General: No focal deficit present.     Mental Status: She is alert and oriented to person, place, and time.  Psychiatric:        Mood and Affect: Mood normal.        Behavior: Behavior normal.        Thought Content: Thought content normal.        Judgment: Judgment normal.        ASSESSMENT & PLAN    Problem List Items Addressed This Visit       Other   Morbid obesity ([facility]) - Primary    - increase rybelsus to 14mg  for weight management - she has been doing well on this.  - she just picked up the 7mg  so we will have her complete her bottle of 7s and then have her switch to the 14s.       Relevant Medications   Semaglutide (RYBELSUS) 14 MG TABS    Return in about 4 months (around 04/22/2022).      Meds ordered this encounter  Medications   Semaglutide (RYBELSUS) 14 MG TABS    Sig: Take 1 tablet (14 mg total) by mouth daily.    Dispense:  90 tablet    Refill:  3    No orders of the defined types were placed in this encounter.    , DO  [facility] [phone] (phone) [phone] (fax)  [facility]

## 2022-04-06 DIAGNOSIS — Z7251 High risk heterosexual behavior: Secondary | ICD-10-CM | POA: Diagnosis not present

## 2022-04-06 DIAGNOSIS — Z0000 Encounter for general adult medical examination without abnormal findings: Secondary | ICD-10-CM | POA: Diagnosis not present

## 2022-04-25 ENCOUNTER — Ambulatory Visit: Payer: Medicaid Other | Admitting: Family Medicine

## 2022-04-25 VITALS — BP 114/69 | HR 81 | Temp 98.3°F | Ht 66.0 in | Wt 249.0 lb

## 2022-04-25 DIAGNOSIS — M546 Pain in thoracic spine: Secondary | ICD-10-CM | POA: Insufficient documentation

## 2022-04-25 DIAGNOSIS — G8929 Other chronic pain: Secondary | ICD-10-CM

## 2022-04-25 DIAGNOSIS — Z6841 Body Mass Index (BMI) 40.0 and over, adult: Secondary | ICD-10-CM

## 2022-04-25 MED ORDER — WEGOVY 0.25 MG/0.5ML ~~LOC~~ SOAJ: 0.2500 mg | SUBCUTANEOUS | 0 refills | Status: DC

## 2022-04-25 NOTE — Assessment & Plan Note (Signed)
-   Related to enlarged breast - pt is 37 H and was formally fitted for bra sizes - says that straps cause indentations in her skin and skin changes  - discussed considering referral to plastic surgery for breast reduction surgery and she said she will think about it

## 2022-04-25 NOTE — Assessment & Plan Note (Signed)
-   pt has done great with lifestyle modification--healthy eating and increasing exercise  - will go ahead and start wegovy as we have not seen much weight loss benefit from rybelsus - believe pt would greatly benefit from [facility]

## 2022-04-25 NOTE — Progress Notes (Signed)
Established patient visit   Patient: Jillian Ruiz   DOB: [birth year]   32 y.o. Female  MRN: [id number] Visit Date: 04/25/2022  Today's healthcare provider: Charlton Amor, DO   Chief Complaint  Patient presents with   Obesity    Rybelsus- 4 month f/u. She has mild nausea, but fine otherwise.     SUBJECTIVE    Chief Complaint  Patient presents with   Obesity    Rybelsus- 4 month f/u. She has mild nausea, but fine otherwise.    HPI HPI     Obesity    Additional comments: Rybelsus- 4 month f/u. She has mild nausea, but fine otherwise.       Last edited by Yolanda Manges, CMA on 04/25/2022  4:04 PM.      Pt presents today to discuss weight management. She is on rybelsus 14mg  daily. Has lost some weight but has not had great weight loss benefit. She has greatly changed her lifestyle and has been eating healthier and walking 2-2 1/2 miles three times a week.  Review of Systems  Constitutional:  Negative for activity change, fatigue and fever.  Respiratory:  Negative for cough and shortness of breath.   Cardiovascular:  Negative for chest pain.  Gastrointestinal:  Negative for abdominal pain.  Genitourinary:  Negative for difficulty urinating.       Current Meds  Medication Sig   WEGOVY 0.25 MG/0.5ML SOAJ Inject 0.25 mg into the skin once a week. Use this dose for 1 month (4 shots) and then increase to next higher dose.   [DISCONTINUED] Semaglutide (RYBELSUS) 14 MG TABS Take 1 tablet (14 mg total) by mouth daily.    OBJECTIVE    BP 114/69   Pulse 81   Temp 98.3 F (36.8 C) (Oral)   Ht 5\' 6"  (1.676 m)   Wt 249 lb (112.9 kg)   SpO2 100%   BMI 40.19 kg/m   Physical Exam Vitals and nursing note reviewed.  Constitutional:      General: She is not in acute distress.    Appearance: She is obese.  HENT:     Head: Normocephalic and atraumatic.     Right Ear: External ear normal.     Left Ear: External ear normal.     Nose: Nose normal.  Eyes:      Conjunctiva/sclera: Conjunctivae normal.  Cardiovascular:     Rate and Rhythm: Normal rate and regular rhythm.  Pulmonary:     Effort: Pulmonary effort is normal.     Breath sounds: Normal breath sounds.  Neurological:     General: No focal deficit present.     Mental Status: She is alert and oriented to person, place, and time.  Psychiatric:        Mood and Affect: Mood normal.        Behavior: Behavior normal.        Thought Content: Thought content normal.        Judgment: Judgment normal.       ASSESSMENT & PLAN    Problem List Items Addressed This Visit       Other   Class 3 severe obesity due to excess calories with serious comorbidity and body mass index (BMI) of 40.0 to 44.9 in adult - Primary    - pt has done great with lifestyle modification--healthy eating and increasing exercise  - will go ahead and start wegovy as we have not seen much weight loss benefit from rybelsus -  believe pt would greatly benefit from wegovy      Relevant Medications   WEGOVY 0.25 MG/0.5ML SOAJ   Chronic thoracic back pain    - Related to enlarged breast - pt is 37 H and was formally fitted for bra sizes - says that straps cause indentations in her skin and skin changes  - discussed considering referral to plastic surgery for breast reduction surgery and she said she will think about it        Return in about 4 weeks (around 05/23/2022).      Meds ordered this encounter  Medications   WEGOVY 0.25 MG/0.5ML SOAJ    Sig: Inject 0.25 mg into the skin once a week. Use this dose for 1 month (4 shots) and then increase to next higher dose.    Dispense:  2 mL    Refill:  0    No orders of the defined types were placed in this encounter.    Charlton Amor, DO  [facility] at [facility] [phone] (phone) [phone] (fax)  [facility]

## 2022-05-02 ENCOUNTER — Encounter: Payer: Self-pay | Admitting: Family Medicine

## 2022-05-23 ENCOUNTER — Encounter: Payer: Self-pay | Admitting: Family Medicine

## 2022-05-23 ENCOUNTER — Ambulatory Visit (INDEPENDENT_AMBULATORY_CARE_PROVIDER_SITE_OTHER): Payer: Medicaid Other | Admitting: Family Medicine

## 2022-05-23 ENCOUNTER — Ambulatory Visit (INDEPENDENT_AMBULATORY_CARE_PROVIDER_SITE_OTHER): Payer: Medicaid Other

## 2022-05-23 DIAGNOSIS — G5602 Carpal tunnel syndrome, left upper limb: Secondary | ICD-10-CM

## 2022-05-23 DIAGNOSIS — M654 Radial styloid tenosynovitis [de Quervain]: Secondary | ICD-10-CM

## 2022-05-23 DIAGNOSIS — G5603 Carpal tunnel syndrome, bilateral upper limbs: Secondary | ICD-10-CM | POA: Insufficient documentation

## 2022-05-23 DIAGNOSIS — M5412 Radiculopathy, cervical region: Secondary | ICD-10-CM

## 2022-05-23 MED ORDER — WEGOVY 0.5 MG/0.5ML ~~LOC~~ SOAJ: 0.5000 mg | SUBCUTANEOUS | 0 refills | Status: DC

## 2022-05-23 MED ORDER — WEGOVY 0.25 MG/0.5ML ~~LOC~~ SOAJ: 0.2500 mg | SUBCUTANEOUS | 0 refills | Status: DC

## 2022-05-23 MED ORDER — CYCLOBENZAPRINE HCL 5 MG PO TABS: 5.0000 mg | ORAL_TABLET | Freq: Every day | ORAL | 0 refills | Status: AC

## 2022-05-23 NOTE — Assessment & Plan Note (Signed)
-   positive finkelstein. Brace will also help. Provided exercises for patient

## 2022-05-23 NOTE — Assessment & Plan Note (Signed)
-   have called [facility] and they have wegovy 0.25 and 0.5 will have patient fill

## 2022-05-23 NOTE — Assessment & Plan Note (Signed)
Wrist brace given in clinic today along with exercises  - pt will do 6 weeks of home PT and then follow up for next steps

## 2022-05-23 NOTE — Progress Notes (Signed)
Established patient visit   Patient: Jillian Ruiz   DOB: 12-20-1990   32 y.o. Female  MRN: [id number] Visit Date: 05/23/2022  Today's healthcare provider: Charlton Amor, DO   Chief Complaint  Patient presents with   Follow-up    SUBJECTIVE    Chief Complaint  Patient presents with   Follow-up   HPI  Patient presents for follow-up on Wegovy.  She has been unable to get the medication at the pharmacy because it has been out.  Patient also is having some pain today with numbness and tingling from her neck down to her hands.  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  Has noticed wrist pain is worse at night.  Review of Systems  Constitutional:  Negative for activity change, fatigue and fever.  Respiratory:  Negative for cough and shortness of breath.   Cardiovascular:  Negative for chest pain.  Gastrointestinal:  Negative for abdominal pain.  Genitourinary:  Negative for difficulty urinating.  Musculoskeletal:        Left wrist pain  Neurological:  Positive for numbness.       Current Meds  Medication Sig   cyclobenzaprine (FLEXERIL) 5 MG tablet Take 1 tablet (5 mg total) by mouth at bedtime.   WEGOVY 0.25 MG/0.5ML SOAJ Inject 0.25 mg into the skin once a week. Use this dose for 1 month (4 shots) and then increase to next higher dose.   WEGOVY 0.5 MG/0.5ML SOAJ Inject 0.5 mg into the skin once a week. Use this dose for 1 month (4 shots) and then increase to next higher dose.    OBJECTIVE    BP 109/72 (BP Location: Right Arm, Cuff Size: Large)   Pulse 85   Resp 20   Ht 5\' 6"  (1.676 m)   Wt 246 lb 0.6 oz (111.6 kg)   SpO2 98%   BMI 39.71 kg/m   Physical Exam Vitals and nursing note reviewed.  Constitutional:      General: She is not in acute distress.    Appearance: Normal appearance.  HENT:     Head: Normocephalic and atraumatic.     Right Ear: External ear normal.     Left Ear: External ear normal.     Nose: Nose  normal.  Eyes:     Conjunctiva/sclera: Conjunctivae normal.  Cardiovascular:     Rate and Rhythm: Normal rate and regular rhythm.  Pulmonary:     Effort: Pulmonary effort is normal.     Breath sounds: Normal breath sounds.  Musculoskeletal:     Comments: Positive spurlings of c spine Positive tinel sign of left Positive finklestein of left  Neurological:     General: No focal deficit present.     Mental Status: She is alert and oriented to person, place, and time.  Psychiatric:        Mood and Affect: Mood normal.        Behavior: Behavior normal.        Thought Content: Thought content normal.        Judgment: Judgment normal.        ASSESSMENT & PLAN    Problem List Items Addressed This Visit       Nervous and Auditory   Carpal tunnel syndrome of left wrist    Wrist brace given in clinic today along with exercises  - pt will do 6 weeks of home PT and then follow up for next steps  Relevant Medications   WEGOVY 0.25 MG/0.5ML SOAJ   WEGOVY 0.5 MG/0.5ML SOAJ   cyclobenzaprine (FLEXERIL) 5 MG tablet   Cervical radiculopathy    - positive spurling's on the left  - have ordered cervical xray to assess for stenosis vs herniation if normal will need mri - pt has muscle pain that is compensating due to pain, have given flexeril       Relevant Medications   WEGOVY 0.25 MG/0.5ML SOAJ   WEGOVY 0.5 MG/0.5ML SOAJ   cyclobenzaprine (FLEXERIL) 5 MG tablet   Other Relevant Orders   DG Cervical Spine Complete     Musculoskeletal and Integument   Tenosynovitis, de Quervain    - positive finkelstein. Brace will also help. Provided exercises for patient        Other   Class 3 severe obesity due to excess calories with serious comorbidity and body mass index (BMI) of 40.0 to 44.9 in adult [facility]) - Primary    - have called [facility] and they have wegovy 0.25 and 0.5 will have patient fill      Relevant Medications   WEGOVY 0.25 MG/0.5ML SOAJ   WEGOVY 0.5 MG/0.5ML  SOAJ    Return in about 6 weeks (around 07/04/2022).      Meds ordered this encounter  Medications   WEGOVY 0.25 MG/0.5ML SOAJ    Sig: Inject 0.25 mg into the skin once a week. Use this dose for 1 month (4 shots) and then increase to next higher dose.    Dispense:  2 mL    Refill:  0   WEGOVY 0.5 MG/0.5ML SOAJ    Sig: Inject 0.5 mg into the skin once a week. Use this dose for 1 month (4 shots) and then increase to next higher dose.    Dispense:  2 mL    Refill:  0   cyclobenzaprine (FLEXERIL) 5 MG tablet    Sig: Take 1 tablet (5 mg total) by mouth at bedtime.    Dispense:  30 tablet    Refill:  0    Orders Placed This Encounter  Procedures   DG Cervical Spine Complete    Standing Status:   Future    Number of Occurrences:   1    Standing Expiration Date:   05/23/2023    Order Specific Question:   Reason for Exam (SYMPTOM  OR DIAGNOSIS REQUIRED)    Answer:   cervical radiculopathy    Order Specific Question:   Is patient pregnant?    Answer:   No    Order Specific Question:   Preferred imaging location?    Answer:   [facility], DO  [facility] Medicine at [facility] [phone] (phone) [phone] (fax)  [facility]

## 2022-05-23 NOTE — Assessment & Plan Note (Addendum)
-   positive spurling's on the left  - have ordered cervical xray to assess for stenosis vs herniation if normal will need mri - pt has muscle pain that is compensating due to pain, have given flexeril

## 2022-05-24 ENCOUNTER — Encounter: Payer: Self-pay | Admitting: Family Medicine

## 2022-05-25 ENCOUNTER — Encounter: Payer: Self-pay | Admitting: Family Medicine

## 2022-05-29 ENCOUNTER — Other Ambulatory Visit: Payer: Self-pay | Admitting: Family Medicine

## 2022-05-30 ENCOUNTER — Other Ambulatory Visit: Payer: Self-pay | Admitting: Family Medicine

## 2022-05-30 DIAGNOSIS — M5412 Radiculopathy, cervical region: Secondary | ICD-10-CM

## 2022-06-02 ENCOUNTER — Telehealth: Payer: Self-pay

## 2022-06-02 NOTE — Telephone Encounter (Signed)
Initiated Prior authorization ZOX:WRUEAV 0.25MG /0.5ML auto-injectors Via: Covermymeds Case/Key:[id number] Status: Pending as of 06/02/22 Reason: Notified Pt via: Mychart

## 2022-06-07 ENCOUNTER — Ambulatory Visit (INDEPENDENT_AMBULATORY_CARE_PROVIDER_SITE_OTHER): Payer: Medicaid Other

## 2022-06-07 DIAGNOSIS — M5412 Radiculopathy, cervical region: Secondary | ICD-10-CM

## 2022-07-03 NOTE — Progress Notes (Unsigned)
     Established patient visit   Patient: Jillian Ruiz   DOB: 05-20-1990   32 y.o. Female  MRN: [id number] Visit Date: 07/04/2022  Today's healthcare provider: Charlton Amor, DO   No chief complaint on file.   SUBJECTIVE   No chief complaint on file.  HPI   Pt presents for follow up on carpal tunnel, dequervain tensynovitis, and obesity on wegovy.     Review of Systems     No outpatient medications have been marked as taking for the 07/04/22 encounter (Appointment) with Charlton Amor, DO.    OBJECTIVE    There were no vitals taken for this visit.  Physical Exam   {Show previous labs (optional):23736}    ASSESSMENT & PLAN    Problem List Items Addressed This Visit   None   No follow-ups on file.      No orders of the defined types were placed in this encounter.   No orders of the defined types were placed in this encounter.    Charlton Amor, DO  [facility] Medicine at [facility] [phone] (phone) [phone] (fax)  [facility]

## 2022-07-04 ENCOUNTER — Encounter: Payer: Self-pay | Admitting: Family Medicine

## 2022-07-04 ENCOUNTER — Ambulatory Visit (INDEPENDENT_AMBULATORY_CARE_PROVIDER_SITE_OTHER): Payer: Medicaid Other | Admitting: Family Medicine

## 2022-07-04 VITALS — BP 126/82 | HR 86 | Ht 66.0 in | Wt 250.0 lb

## 2022-07-04 DIAGNOSIS — M654 Radial styloid tenosynovitis [de Quervain]: Secondary | ICD-10-CM | POA: Diagnosis not present

## 2022-07-04 DIAGNOSIS — G5603 Carpal tunnel syndrome, bilateral upper limbs: Secondary | ICD-10-CM

## 2022-07-04 DIAGNOSIS — Z6841 Body Mass Index (BMI) 40.0 and over, adult: Secondary | ICD-10-CM | POA: Diagnosis not present

## 2022-07-04 MED ORDER — RYBELSUS 7 MG PO TABS: 7.0000 mg | ORAL_TABLET | Freq: Every day | ORAL | 0 refills | Status: DC

## 2022-07-04 MED ORDER — RYBELSUS 14 MG PO TABS
14.0000 mg | ORAL_TABLET | Freq: Every day | ORAL | 3 refills | Status: DC
Start: 2022-07-04 — End: 2023-07-31

## 2022-07-04 NOTE — Assessment & Plan Note (Addendum)
-   pt is following with healthy weight and wellness next week - have increased pt rybelsus- she is approved for it for a year per insurance for obesity - sent in the 7mg  tab and then 14 mg tab

## 2022-07-04 NOTE — Assessment & Plan Note (Signed)
Patient notes improvement with bracing

## 2022-07-04 NOTE — Assessment & Plan Note (Signed)
-   Patient has had greater than 6 weeks of physical therapy along with bracing and carpal tunnel exercises and is continuing to have residual carpal tunnel symptoms.  She is now noting dropping of multiple objects and she is unable to open or twist tops -I have sent a referral to sports medicine for bilateral carpal tunnel injections.

## 2022-07-09 ENCOUNTER — Encounter: Payer: Self-pay | Admitting: Bariatrics

## 2022-07-09 ENCOUNTER — Ambulatory Visit (INDEPENDENT_AMBULATORY_CARE_PROVIDER_SITE_OTHER): Payer: Medicaid Other | Admitting: Bariatrics

## 2022-07-09 VITALS — BP 115/81 | HR 85 | Temp 98.1°F | Ht 65.0 in | Wt 242.0 lb

## 2022-07-09 DIAGNOSIS — M25562 Pain in left knee: Secondary | ICD-10-CM

## 2022-07-09 DIAGNOSIS — Z6841 Body Mass Index (BMI) 40.0 and over, adult: Secondary | ICD-10-CM

## 2022-07-09 DIAGNOSIS — E7800 Pure hypercholesterolemia, unspecified: Secondary | ICD-10-CM | POA: Diagnosis not present

## 2022-07-09 DIAGNOSIS — G8929 Other chronic pain: Secondary | ICD-10-CM

## 2022-07-09 NOTE — Progress Notes (Signed)
Office: [phone]  /  Fax: [phone]   Initial Visit  Jillian Ruiz was seen in clinic today to evaluate for obesity. She is interested in losing weight to improve overall health and reduce the risk of weight related complications. She presents today to review program treatment options, initial physical assessment, and evaluation.     She was referred by: PCP  When asked what else they would like to accomplish? She states: Adopt healthier eating patterns, Improve energy levels and physical activity, Improve quality of life, and Other: reduce of risk for diabetes in the future ( family history).   When asked how has your weight affected you? She states: Contributed to orthopedic problems or mobility issues, Having fatigue, and Having poor endurance  Some associated conditions: Other: chronic pain, left knee  Contributing factors: Family history and Reduced physical activity  Weight promoting medications identified: Contraceptives or hormonal therapy  Current nutrition plan: High-protein and Portion control / smart choices  Current level of physical activity: None and Walking  Current or previous pharmacotherapy: Other: Rybelsus  Response to medication:  Taking Rybelsus   Past medical history includes:   Past Medical History:  Diagnosis Date   HSV (herpes simplex virus) anogenital infection    Left leg weakness    Medical history non-contributory    Post-partum depression      Objective:   BP 115/81   Pulse 85   Temp 98.1 F (36.7 C)   Ht 5\' 5"  (1.651 m)   Wt 242 lb (109.8 kg)   SpO2 99%   BMI 40.27 kg/m  She was weighed on the bioimpedance scale: Body mass index is 40.27 kg/m.  Peak Weight:295 lbs , Body Fat%:46.9, Visceral Fat Rating:12, Weight trend over the last 12 months: Decreasing, but up slightly over the last months.   General:  Alert, oriented and cooperative. Patient is in no acute distress.  Respiratory: Normal respiratory effort, no problems  with respiration noted  Extremities: Normal range of motion.    Mental Status: Normal mood and affect. Normal behavior. Normal judgment and thought content.   DIAGNOSTIC DATA REVIEWED:  BMET    Component Value Date/Time   NA 138 08/11/2021 0000   K 4.2 08/11/2021 0000   CL 103 08/11/2021 0000   CO2 27 08/11/2021 0000   GLUCOSE 78 08/11/2021 0000   BUN 10 08/11/2021 0000   CREATININE 0.69 08/11/2021 0000   CALCIUM 9.4 08/11/2021 0000   GFRNONAA >60 05/21/2019 0641   GFRAA >60 05/21/2019 0641   Lab Results  Component Value Date   HGBA1C 5.4 08/11/2021   No results found for: "INSULIN" CBC    Component Value Date/Time   WBC 6.2 08/11/2021 0000   RBC 4.29 08/11/2021 0000   HGB 11.6 (L) 08/11/2021 0000   HCT 35.5 08/11/2021 0000   PLT 278 08/11/2021 0000   MCV 82.8 08/11/2021 0000   MCH 27.0 08/11/2021 0000   MCHC 32.7 08/11/2021 0000   RDW 12.8 08/11/2021 0000   Iron/TIBC/Ferritin/ %Sat No results found for: "IRON", "TIBC", "FERRITIN", "IRONPCTSAT" Lipid Panel     Component Value Date/Time   CHOL 168 08/11/2021 0000   TRIG 56 08/11/2021 0000   HDL 50 08/11/2021 0000   CHOLHDL 3.4 08/11/2021 0000   LDLCALC 104 (H) 08/11/2021 0000   Hepatic Function Panel     Component Value Date/Time   PROT 8.0 08/11/2021 0000   AST 10 08/11/2021 0000   ALT 9 08/11/2021 0000   BILITOT 0.3 08/11/2021 0000  No results found for: "TSH"   Assessment and Plan:   Chronic pain of left knee:  She has some intermittent pain and more pain with certain activities.   Plan: will continue exercise, no pounding exercise.   2.   Elevated LDL:   Last LDL greater than 100  Plan: Will begin the plan and exercise.    3. Family history of  DM type 2   Maternal history  Plan: Will begin the plan and exercise.    Morbid obesity (HCC)  BMI 40.0-44.9, adult (HCC)     Morbid Obesity: Current BMI 40    Obesity Treatment / Action Plan:  Patient will work on garnering  support from family and friends to begin weight loss journey. Will work on eliminating or reducing the presence of highly palatable, calorie dense foods in the home. Will complete provided nutritional and psychosocial assessment questionnaire before the next appointment. Will be scheduled for indirect calorimetry to determine resting energy expenditure in a fasting state.  This will allow [place] to create a reduced calorie, high-protein meal plan to promote loss of fat mass while preserving muscle mass. Counseled on the health benefits of losing 5%-15% of total body weight. Was counseled on nutritional approaches to weight loss and benefits of reducing processed foods and consuming plant-based foods and high quality protein as part of nutritional weight management. Was counseled on pharmacotherapy and role as an adjunct in weight management.   Obesity Education Performed Today:  She was weighed on the bioimpedance scale and results were discussed and documented in the synopsis.  We discussed obesity as a disease and the importance of a more detailed evaluation of all the factors contributing to the disease.  We discussed the importance of long term lifestyle changes which include nutrition, exercise and behavioral modifications as well as the importance of customizing this to her specific health and social needs.  We discussed the benefits of reaching a healthier weight to alleviate the symptoms of existing conditions and reduce the risks of the biomechanical, metabolic and psychological effects of obesity.  Discussed New Patient/Late Arrival, and Cancellation Policies. Patient voiced understanding and allowed to ask questions.   Jillian Ruiz appears to be in the action stage of change and states they are ready to start intensive lifestyle modifications and behavioral modifications.  30 minutes was spent today on this visit including the above counseling, pre-visit chart review, and post-visit  documentation.  Reviewed by clinician on day of visit: allergies, medications, problem list, medical history, surgical history, family history, social history, and previous encounter notes.    Rajeev Escue A. Lorretta HarpO.

## 2022-07-10 ENCOUNTER — Ambulatory Visit: Payer: Medicaid Other | Admitting: Sports Medicine

## 2022-07-10 ENCOUNTER — Other Ambulatory Visit (INDEPENDENT_AMBULATORY_CARE_PROVIDER_SITE_OTHER): Payer: Medicaid Other

## 2022-07-10 ENCOUNTER — Encounter: Payer: Self-pay | Admitting: Sports Medicine

## 2022-07-10 DIAGNOSIS — G5603 Carpal tunnel syndrome, bilateral upper limbs: Secondary | ICD-10-CM

## 2022-07-10 NOTE — Assessment & Plan Note (Signed)
This is a very pleasant 32 year old female, she does here, she has had months of discomfort both hands and wrists, with numbness and tingling worse at night, she has done at least 6 weeks of bracing, carpal tunnel exercises but unfortunate continues to have symptoms. In addition to numbness and tingling in the hands she does complain of pain, numbness and tingling coming from the neck all the way down the arm on the left. Today on exam she did not have any thenar atrophy, she had positive Tinel's and Phalen signs bilaterally. I think she likely has a combination of radicular symptoms as well as carpal tunnel syndrome, today we did bilateral median nerve hydrodissection's, this will address the carpal tunnel component of her discomfort, at the 6-week follow-up if she continues to have radicular discomfort we will focus further evaluation on her neck.

## 2022-07-10 NOTE — Progress Notes (Signed)
    Procedures performed today:    Procedure: Real-time Ultrasound Guided hydrodissection of the left median nerve at the carpal tunnel Device: Samsung HS60 Verbal informed consent obtained.  Time-out conducted.  Noted no overlying erythema, induration, or other signs of local infection.  Skin prepped in a sterile fashion.  Local anesthesia: Topical Ethyl chloride.  With sterile technique and under real time ultrasound guidance: I noted an enlarged nerve. Using a 25-gauge needle advanced into the carpal tunnel, taking care to avoid intraneural injection I injected medication both superficial to and deep to the median nerve freeing it from surrounding structures, I then redirected the needle deep and injected further medication around the flexor tendons deep within the carpal tunnel for a total of 1 cc kenalog 40, 5 cc 1% lidocaine without epinephrine. Completed without difficulty  Advised to call if fevers/chills, erythema, induration, drainage, or persistent bleeding.  Images permanently stored and available for review in PACS.  Impression: Technically successful ultrasound guided median nerve hydrodissection.   Procedure: Real-time Ultrasound Guided hydrodissection of the right median nerve at the carpal tunnel Device: Samsung HS60 Verbal informed consent obtained.  Time-out conducted.  Noted no overlying erythema, induration, or other signs of local infection.  Skin prepped in a sterile fashion.  Local anesthesia: Topical Ethyl chloride.  With sterile technique and under real time ultrasound guidance: I noted an enlarged nerve. Using a 25-gauge needle advanced into the carpal tunnel, taking care to avoid intraneural injection I injected medication both superficial to and deep to the median nerve freeing it from surrounding structures, I then redirected the needle deep and injected further medication around the flexor tendons deep within the carpal tunnel for a total of 1 cc kenalog 40, 5  cc 1% lidocaine without epinephrine. Completed without difficulty  Advised to call if fevers/chills, erythema, induration, drainage, or persistent bleeding.  Images permanently stored and available for review in PACS.  Impression: Technically successful ultrasound guided median nerve hydrodissection.  Independent interpretation of notes and tests performed by another provider:   None.  Brief History, Exam, Impression, and Recommendations:    Bilateral carpal tunnel syndrome This is a very pleasant 32 year old female, she does here, she has had months of discomfort both hands and wrists, with numbness and tingling worse at night, she has done at least 6 weeks of bracing, carpal tunnel exercises but unfortunate continues to have symptoms. In addition to numbness and tingling in the hands she does complain of pain, numbness and tingling coming from the neck all the way down the arm on the left. Today on exam she did not have any thenar atrophy, she had positive Tinel's and Phalen signs bilaterally. I think she likely has a combination of radicular symptoms as well as carpal tunnel syndrome, today we did bilateral median nerve hydrodissection's, this will address the carpal tunnel component of her discomfort, at the 6-week follow-up if she continues to have radicular discomfort we will focus further evaluation on her neck.    ____________________________________________ Ihor Austin. Benjamin Stain, M.D., ABFM., CAQSM., AME. Primary Care and Sports Medicine [place] [facility]  Adjunct Professor of Family Medicine  [employer]  Restaurant manager, fast food

## 2022-07-24 ENCOUNTER — Ambulatory Visit: Payer: Medicaid Other | Admitting: Bariatrics

## 2022-08-07 ENCOUNTER — Ambulatory Visit: Payer: Medicaid Other | Admitting: Nurse Practitioner

## 2022-08-20 ENCOUNTER — Ambulatory Visit (INDEPENDENT_AMBULATORY_CARE_PROVIDER_SITE_OTHER): Payer: Medicaid Other | Admitting: Family Medicine

## 2022-08-20 DIAGNOSIS — Z91199 Patient's noncompliance with other medical treatment and regimen due to unspecified reason: Secondary | ICD-10-CM

## 2022-08-20 NOTE — Progress Notes (Signed)
No show

## 2022-08-21 ENCOUNTER — Ambulatory Visit: Payer: Medicaid Other | Admitting: Sports Medicine

## 2022-10-15 ENCOUNTER — Telehealth: Payer: Self-pay

## 2022-10-15 NOTE — Telephone Encounter (Signed)
Medicaid Managed Care   Unsuccessful Outreach Note  10/15/2022 Name: Jillian Ruiz MRN: [id number] DOB: November 25, 1990  Referred by: Charlton Amor, DO Reason for referral : No chief complaint on file.   An unsuccessful telephone outreach was attempted today. The patient was referred to the case management team for assistance with care management and care coordination.   Follow Up Plan: If patient returns call to provider office, please advise to call Embedded Care Management Care Guide Nicholes Rough* at [phone]*  Nicholes Rough, CMA Care Guide [employer]

## 2023-01-16 ENCOUNTER — Encounter: Payer: Self-pay | Admitting: Bariatrics

## 2023-03-11 DIAGNOSIS — Z0289 Encounter for other administrative examinations: Secondary | ICD-10-CM

## 2023-03-12 ENCOUNTER — Ambulatory Visit: Payer: Medicaid Other | Admitting: Bariatrics

## 2023-03-19 ENCOUNTER — Encounter: Payer: Self-pay | Admitting: Bariatrics

## 2023-03-19 ENCOUNTER — Ambulatory Visit: Payer: Medicaid Other | Admitting: Bariatrics

## 2023-03-19 VITALS — BP 114/78 | HR 72 | Temp 98.3°F | Ht 65.0 in | Wt 255.0 lb

## 2023-03-19 DIAGNOSIS — R0602 Shortness of breath: Secondary | ICD-10-CM

## 2023-03-19 DIAGNOSIS — Z6841 Body Mass Index (BMI) 40.0 and over, adult: Secondary | ICD-10-CM | POA: Diagnosis not present

## 2023-03-19 DIAGNOSIS — Z0000 Encounter for general adult medical examination without abnormal findings: Secondary | ICD-10-CM

## 2023-03-19 DIAGNOSIS — E7800 Pure hypercholesterolemia, unspecified: Secondary | ICD-10-CM | POA: Diagnosis not present

## 2023-03-19 DIAGNOSIS — E739 Lactose intolerance, unspecified: Secondary | ICD-10-CM

## 2023-03-19 DIAGNOSIS — R5383 Other fatigue: Secondary | ICD-10-CM

## 2023-03-19 DIAGNOSIS — E66813 Obesity, class 3: Secondary | ICD-10-CM | POA: Diagnosis not present

## 2023-03-19 DIAGNOSIS — Z1331 Encounter for screening for depression: Secondary | ICD-10-CM

## 2023-03-19 DIAGNOSIS — E559 Vitamin D deficiency, unspecified: Secondary | ICD-10-CM

## 2023-03-19 NOTE — Progress Notes (Signed)
 At a Glance:  Vitals Temp: 98.3 F (36.8 C) BP: 114/78 Pulse Rate: 72 SpO2: 100 %   Anthropometric Measurements Height: 5\' 5"  (1.651 m) Weight: 255 lb (115.7 kg) BMI (Calculated): 42.43 Starting Weight: 255lb Peak Weight: 298lb Waist Measurement : 41 inches   Body Composition  Body Fat %: 48.5 % Fat Mass (lbs): 124 lbs Muscle Mass (lbs): 125 lbs Total Body Water (lbs): 86.6 lbs Visceral Fat Rating : 13   Other Clinical Data Fasting: Yes Labs: Yes Today's Visit #: 1 Starting Date: 03/19/23    EKG: Normal sinus rhythm, rate 74.  Indirect Calorimeter:   Resting Metabolic Rate ( RMR):  RMR (actual): 1771 kcal  RMR (calculated): 1897 kcal The calculated basal metabolic rate is 1610 thus her basal metabolic rate is worse than expected.  Plan:   Indirect calorimeter completed, interpreted and reviewed with patient today and allowed to ask questions.  Discussed the implications for the chosen plan and exercise based on the RMR reading.  Will consider repeating the RMR in the future based on weight loss.    Chief Complaint:  Obesity   Subjective:  Jillian Ruiz (MR# [id number]) is a 33 y.o. female who presents for evaluation and treatment of obesity and related comorbidities.   Jillian Ruiz is currently in the action stage of change and ready to dedicate time achieving and maintaining a healthier weight. Jillian Ruiz is interested in becoming our patient and working on intensive lifestyle modifications including (but not limited to) diet and exercise for weight loss.  Jillian Ruiz has been struggling with her weight. She has been unsuccessful in either losing weight, maintaining weight loss, or reaching her healthy weight goal.  Jillian Ruiz's habits were reviewed today and are as follows: Her family eats meals together, she thinks her family will eat healthier with her, she started gaining weight with birth of her children, she has significant food cravings issues, she snacks  frequently in the evenings, and she skips meals frequently.   She started gaining weight with the her pregnancies. .   Current or previous pharmacotherapy: Other: Rybelsus  Response to medication:  continuing   Other Fatigue Jillian Ruiz admits to daytime somnolence and admits to waking up still tired. Patient has a history of symptoms of daytime fatigue. Jillian Ruiz generally gets 5 or 6 hours of sleep per night, and states that she has difficulty falling back asleep if awakened. Snoring is not present. Apneic episodes is not present. Epworth Sleepiness Score is 12. .  Shortness of Breath Jillian Ruiz notes increasing shortness of breath with exercising and seems to be worsening over time with weight gain. She notes getting out of breath sooner with activity than she used to. This has not gotten worse recently. Jillian Ruiz denies shortness of breath at rest or orthopnea.  Depression Screen Jillian Ruiz Food and Mood (modified PHQ-9) score was 12. 10-14 moderate depression     05/23/2022    4:26 PM  Depression screen PHQ 2/9  Decreased Interest 1  Down, Depressed, Hopeless 1  PHQ - 2 Score 2  Altered sleeping 3  Tired, decreased energy 1  Change in appetite 0  Feeling bad or failure about yourself  2  Trouble concentrating 1  Moving slowly or fidgety/restless 1  Suicidal thoughts 1  PHQ-9 Score 11  Difficult doing work/chores Somewhat difficult     Assessment and Plan:   Other Fatigue Jillian Ruiz does feel that her weight is causing her energy to be lower than it should be. Fatigue may be related to  obesity, depression or many other causes. Labs will be ordered, and in the meanwhile, Jillian Ruiz will focus on self care including making healthy food choices, increasing physical activity and focusing on stress reduction.  Shortness of Breath Jillian Ruiz does not feel that she gets out of breath more easily that she used to when she exercises. Jillian Ruiz's shortness of breath appears to be obesity related and exercise  induced. She has agreed to work on weight loss and gradually increase exercise to treat her exercise induced shortness of breath. Will continue to monitor closely.  Health Maintenance:   Obesity   Plan: Will do EKG, indirect calorimetry, and labs.     Vitamin D Deficiency She is at risk for vitamin D deficiency due to obesity. She is not on any vitamin D   Plan: Will check for vitamin D deficiency.   Jillian Ruiz had a positive depression screening. Depression is commonly associated with obesity and often results in emotional eating behaviors. We will monitor this closely and work on CBT to help improve the non-hunger eating patterns. Referral to Psychology may be required if no improvement is seen as she continues in our clinic.   Lactose Intolerance:   She has a history of lactose intolerance for years. She tries to limit her lactose options.   Plan: Discussed lactose free options for shakes and yogurt.    Elevated LDL cholesterol :   Her LDL has been elevated in the past.   Plan:  Will do a lipid panel.    Previous labs reviewed today. Date: 08/11/21, not current   Labs done today CMP, Lipids, Insulin, HgbA1c, Vit D, and Thyroid Panel   Morbid Obesity: BMI (Calculated): 42.43   Jillian Ruiz is currently in the action stage of change and her goal is to begin weight loss efforts. I recommend Jillian Ruiz begin the structured treatment plan as follows:  She has agreed to Category 2 Plan + 100 calories   Exercise goals: All adults should avoid inactivity. Some activity is better than none, and adults who participate in any amount of physical activity, gain some health benefits.  Behavioral modification strategies:increasing lean protein intake, increase H2O intake, decreasing eating out, no skipping meals, and avoiding temptations  She was informed of the importance of frequent follow-up visits to maximize her success with intensive lifestyle modifications for her multiple health  conditions. She was informed we would discuss her lab results at her next visit unless there is a critical issue that needs to be addressed sooner. Jillian Ruiz agreed to keep her next visit at the agreed upon time to discuss these results.  Objective:  General: Cooperative, alert, well developed, in no acute distress. HEENT: Conjunctivae and lids unremarkable. Cardiovascular: Regular rhythm.  Lungs: Normal work of breathing. Neurologic: No focal deficits.   Lab Results  Component Value Date   CREATININE 0.69 08/11/2021   BUN 10 08/11/2021   NA 138 08/11/2021   K 4.2 08/11/2021   CL 103 08/11/2021   CO2 27 08/11/2021   Lab Results  Component Value Date   ALT 9 08/11/2021   AST 10 08/11/2021   BILITOT 0.3 08/11/2021   Lab Results  Component Value Date   HGBA1C 5.4 08/11/2021   No results found for: "INSULIN" No results found for: "TSH" Lab Results  Component Value Date   CHOL 168 08/11/2021   HDL 50 08/11/2021   LDLCALC 104 (H) 08/11/2021   TRIG 56 08/11/2021   CHOLHDL 3.4 08/11/2021   Lab Results  Component Value Date  WBC 6.2 08/11/2021   HGB 11.6 (L) 08/11/2021   HCT 35.5 08/11/2021   MCV 82.8 08/11/2021   PLT 278 08/11/2021   No results found for: "IRON", "TIBC", "FERRITIN"  Attestation Statements:  Time spent on visit including the items listed below was 42 minutes.  -preparing to see the patient (e.g., review of tests, history, previous notes) -obtaining and/or reviewing separately obtained history -counseling and educating the patient/family/caregiver -documenting clinical information in the electronic or other health record -ordering medications, tests, or procedures -independently interpreting results and communicating results to the patient/ family/caregiver -referring and communicating with other health care professionals  -care coordination    Applicable history such as the following:  allergies, medications, problem list, medical history, surgical  history, family history, social history, and previous encounter notes reviewed by clinician on day of visit:   This may have been prepared with the assistance of Engineer, civil (consulting).  Occasional wrong-word or sound-a-like substitutions may have occurred due to the inherent limitations of voice recognition software.    Jazmyn Offner A. Manson Passey, DO

## 2023-03-20 LAB — VITAMIN D 25 HYDROXY (VIT D DEFICIENCY, FRACTURES): Vit D, 25-Hydroxy: 11.1 ng/mL — ABNORMAL LOW (ref 30.0–100.0)

## 2023-03-20 LAB — COMPREHENSIVE METABOLIC PANEL
ALT: 9 IU/L (ref 0–32)
AST: 13 IU/L (ref 0–40)
Albumin: 4.4 g/dL (ref 3.9–4.9)
Alkaline Phosphatase: 69 IU/L (ref 44–121)
BUN/Creatinine Ratio: 12 (ref 9–23)
BUN: 8 mg/dL (ref 6–20)
Bilirubin Total: 0.2 mg/dL (ref 0.0–1.2)
CO2: 24 mmol/L (ref 20–29)
Calcium: 9.6 mg/dL (ref 8.7–10.2)
Chloride: 100 mmol/L (ref 96–106)
Creatinine, Ser: 0.66 mg/dL (ref 0.57–1.00)
Globulin, Total: 3.3 g/dL (ref 1.5–4.5)
Glucose: 88 mg/dL (ref 70–99)
Potassium: 4.5 mmol/L (ref 3.5–5.2)
Sodium: 138 mmol/L (ref 134–144)
Total Protein: 7.7 g/dL (ref 6.0–8.5)
eGFR: 118 mL/min/{1.73_m2} (ref >59)

## 2023-03-20 LAB — TSH+T4F+T3FREE
Free T4: 0.99 ng/dL (ref 0.82–1.77)
T3, Free: 3 pg/mL (ref 2.0–4.4)
TSH: 1.52 u[IU]/mL (ref 0.450–4.500)

## 2023-03-20 LAB — LIPID PANEL WITH LDL/HDL RATIO
Cholesterol, Total: 168 mg/dL (ref 100–199)
HDL: 51 mg/dL (ref >39)
LDL Chol Calc (NIH): 105 mg/dL — ABNORMAL HIGH (ref 0–99)
LDL/HDL Ratio: 2.1 ratio (ref 0.0–3.2)
Triglycerides: 64 mg/dL (ref 0–149)
VLDL Cholesterol Cal: 12 mg/dL (ref 5–40)

## 2023-03-20 LAB — INSULIN, RANDOM: INSULIN: 6.7 u[IU]/mL (ref 2.6–24.9)

## 2023-03-20 LAB — HEMOGLOBIN A1C
Est. average glucose Bld gHb Est-mCnc: 114 mg/dL
Hgb A1c MFr Bld: 5.6 % (ref 4.8–5.6)

## 2023-03-26 ENCOUNTER — Ambulatory Visit: Payer: Medicaid Other | Admitting: Bariatrics

## 2023-03-26 ENCOUNTER — Encounter: Payer: Self-pay | Admitting: Bariatrics

## 2023-03-26 VITALS — BP 123/78 | HR 86 | Temp 98.5°F | Ht 65.0 in | Wt 250.0 lb

## 2023-03-26 DIAGNOSIS — Z6841 Body Mass Index (BMI) 40.0 and over, adult: Secondary | ICD-10-CM | POA: Diagnosis not present

## 2023-03-26 DIAGNOSIS — E739 Lactose intolerance, unspecified: Secondary | ICD-10-CM | POA: Diagnosis not present

## 2023-03-26 DIAGNOSIS — E559 Vitamin D deficiency, unspecified: Secondary | ICD-10-CM

## 2023-03-26 MED ORDER — VITAMIN D (ERGOCALCIFEROL) 1.25 MG (50000 UNIT) PO CAPS
50000.0000 [IU] | ORAL_CAPSULE | ORAL | 0 refills | Status: DC
Start: 2023-03-26 — End: 2023-06-04

## 2023-03-26 NOTE — Progress Notes (Signed)
 First follow-up    WEIGHT SUMMARY AND BIOMETRICS  Weight Lost Since Last Visit: 5 lb  Weight Gained Since Last Visit: 0 lb   Vitals Temp: 98.5 F (36.9 C) BP: 123/78 Pulse Rate: 86 SpO2: 98 %   Anthropometric Measurements Height: 5\' 5"  (1.651 m) Weight: 250 lb (113.4 kg) BMI (Calculated): 41.6 Weight at Last Visit: 255 lb Weight Lost Since Last Visit: 5 lb Weight Gained Since Last Visit: 0 lb Starting Weight: 255 lb Total Weight Loss (lbs): 5 lb (2.268 kg) Peak Weight: 298 lb   Body Composition  Body Fat %: 47.3 % Fat Mass (lbs): 118.4 lbs Muscle Mass (lbs): 125.4 lbs Total Body Water (lbs): 86.8 lbs Visceral Fat Rating : 13   Other Clinical Data RMR: 1771 Fasting: No Labs: No Today's Visit #: 2 Starting Date: 03/19/23    OBESITY Flora is here to discuss her progress with her obesity treatment plan along with follow-up of her obesity related diagnoses.    Nutrition Plan: the Category 2 plan + 100 - 65-70% adherence.  Current exercise: walking  Interim History:  She is down 5 lbs since her last visit.  Eating all of the food on the plan., Protein intake is as prescribed, Is skipping meals, and Water intake is inadequate.   Pharmacotherapy: Laree is on Rybelsus 14 mg daily  Adverse side effects: None Hunger is well controlled.  Cravings are moderately controlled.  Assessment/Plan:   Vitamin D Deficiency Vitamin D is not at goal of 50.  Most recent vitamin D level was 11.1. She is not on vitamin D, but she did start vitamin D OTC.  Lab Results  Component Value Date   VD25OH 11.1 (L) 03/19/2023    Plan: Begin prescription vitamin D 50,000 IU weekly.   Lactose Intolerance:   She is lactose intolerant. She carries lactose pills.  Plan:  She will avoid foods that cause GI issues.   Labs reviewed today (CMP, Lipids, HgbA1c, insulin,  vitamin D,  and thyroid panel).    Morbid Obesity: Current BMI BMI (Calculated): 41.6   Pharmacotherapy Plan Continue  Rybelsus 14 mg daily before breakfast  Averianna is currently in the action stage of change. As such, her goal is to continue with weight loss efforts.  She has agreed to the Category 2 plan + `100 calories  Exercise goals: All adults should avoid inactivity. Some physical activity is better than none, and adults who participate in any amount of physical activity gain some health benefits. She will be walking more.   Behavioral modification strategies: increasing lean protein intake, no meal skipping, decrease eating out, meal planning , increase water intake, better snacking choices, increasing vegetables, and mindful eating. Given information for additional lunch and dinner meals. Smart fruit sheet.  Seasoning sheet.   Evann has agreed to follow-up with our clinic in 2 weeks.      Objective:  VITALS: Per patient if applicable, see vitals. GENERAL: Alert and in no acute distress. CARDIOPULMONARY: No increased WOB. Speaking in clear sentences.  PSYCH: Pleasant and cooperative. Speech normal rate and rhythm. Affect is appropriate. Insight and judgement are appropriate. Attention is focused, linear, and appropriate.  NEURO: Oriented as arrived to appointment on time with no prompting.   Attestation Statements:   This was prepared with the assistance of Engineer, civil (consulting).  Occasional wrong-word or sound-a-like substitutions may have occurred due to the inherent limitations of voice recognition   Corinna Capra, DO

## 2023-04-09 ENCOUNTER — Encounter: Payer: Self-pay | Admitting: Bariatrics

## 2023-04-09 ENCOUNTER — Ambulatory Visit: Admitting: Bariatrics

## 2023-04-09 VITALS — BP 114/72 | HR 85 | Temp 98.2°F | Ht 65.0 in | Wt 253.0 lb

## 2023-04-09 DIAGNOSIS — E559 Vitamin D deficiency, unspecified: Secondary | ICD-10-CM | POA: Diagnosis not present

## 2023-04-09 DIAGNOSIS — F5089 Other specified eating disorder: Secondary | ICD-10-CM

## 2023-04-09 DIAGNOSIS — E669 Obesity, unspecified: Secondary | ICD-10-CM | POA: Diagnosis not present

## 2023-04-09 DIAGNOSIS — E66813 Obesity, class 3: Secondary | ICD-10-CM

## 2023-04-09 DIAGNOSIS — Z6841 Body Mass Index (BMI) 40.0 and over, adult: Secondary | ICD-10-CM

## 2023-04-09 MED ORDER — BUPROPION HCL ER (SR) 150 MG PO TB12: 150.0000 mg | ORAL_TABLET | Freq: Every day | ORAL | 0 refills | Status: DC

## 2023-04-09 NOTE — Progress Notes (Signed)
 WEIGHT SUMMARY AND BIOMETRICS  Weight Lost Since Last Visit: 0  Weight Gained Since Last Visit: 3lb   Vitals Temp: 98.2 F (36.8 C) BP: 114/72 Pulse Rate: 85 SpO2: 100 %   Anthropometric Measurements Height: 5\' 5"  (1.651 m) Weight: 253 lb (114.8 kg) BMI (Calculated): 42.1 Weight at Last Visit: 250lb Weight Lost Since Last Visit: 0 Weight Gained Since Last Visit: 3lb Starting Weight: 255lb Total Weight Loss (lbs): 2 lb (0.907 kg) Peak Weight: 298lb   Body Composition  Body Fat %: 47.7 % Fat Mass (lbs): 121 lbs Muscle Mass (lbs): 126.2 lbs Total Body Water (lbs): 88.6 lbs Visceral Fat Rating : 13   Other Clinical Data Fasting: no Labs: no Today's Visit #: 3 Starting Date: 03/19/23    OBESITY Giovana is here to discuss her progress with her obesity treatment plan along with follow-up of her obesity related diagnoses.    Nutrition Plan: the Category 2 plan  + 100 calories - 70% adherence.  Current exercise: walking  Interim History:  She is up 3 lbs since her last visit.  Eating all of the food on the plan., Protein intake is as prescribed, Is not skipping meals, and Reports polyphagia   Pharmacotherapy: Tiona is on Rybelsus 14 mg daily before breakfast Adverse side effects: None Hunger is moderately controlled.  Cravings are poorly controlled.  Assessment/Plan:   1. Vitamin D deficiency Vitamin D Deficiency Vitamin D is not at goal of 50.  Most recent vitamin D level was 11.1. She is on  prescription ergocalciferol 50,000 IU weekly. Lab Results  Component Value Date   VD25OH 11.1 (L) 03/19/2023    Plan: Refill prescription vitamin D 50,000 IU weekly.   Eating disorder/emotional eating Rafaela has had issues with stress eating, emotional eating, and nighttime eating. Currently this is poorly controlled. Overall mood is stable.  Denies suicidal/homicidal ideation. Medication(s): Wellbutrin 150 mg daily in the am  Plan:  Motivational interviewing as well as evidence-based interventions for health behavior change were utilized today including the discussion of self monitoring techniques, problem-solving barriers and SMART goal setting techniques.  Consider a referral to Dr. Dewaine Conger, PhD psychologist to help with eating behaviors.  Discussed distractions to curb eating behaviors. Discussed activities to do with one's hands in the evening  Be sure to get adequate rest as lack of rest can trigger appetite.  Have plan in place for stressful events.  Consider other rewards besides food.     Generalized Obesity: Current BMI BMI (Calculated): 42.1   Pharmacotherapy Plan Continue  Rybelsus 14 mg daily before breakfast  Darianna is currently in the action stage of change. As such, her goal is to continue with weight loss efforts.  She has agreed to the Category 2 plan plus 100 calories.   Exercise goals: All adults should avoid inactivity. Some physical activity is better than none, and  adults who participate in any amount of physical activity gain some health benefits.  She will continue to walk 3-4 times a week.   Behavioral modification strategies: increasing lean protein intake, decreasing simple carbohydrates , no meal skipping, meal planning , increase water intake, better snacking choices, planning for success, emotional eating strategies, get rid of junk food in the home, and keep healthy foods in the home.  Nechelle has agreed to follow-up with our clinic in 2 weeks.       Objective:   VITALS: Per patient if applicable, see vitals. GENERAL: Alert and in no acute distress. CARDIOPULMONARY: No increased WOB. Speaking in clear sentences.  PSYCH: Pleasant and cooperative. Speech normal rate and rhythm. Affect is appropriate. Insight and judgement are appropriate. Attention is focused, linear, and appropriate.  NEURO:  Oriented as arrived to appointment on time with no prompting.   Attestation Statements:    This was prepared with the assistance of Engineer, civil (consulting).  Occasional wrong-word or sound-a-like substitutions may have occurred due to the inherent limitations of voice recognition   Corinna Capra, DO

## 2023-04-12 DIAGNOSIS — Z0000 Encounter for general adult medical examination without abnormal findings: Secondary | ICD-10-CM | POA: Diagnosis not present

## 2023-04-23 ENCOUNTER — Ambulatory Visit (INDEPENDENT_AMBULATORY_CARE_PROVIDER_SITE_OTHER): Admitting: Bariatrics

## 2023-04-23 ENCOUNTER — Encounter: Payer: Self-pay | Admitting: Bariatrics

## 2023-04-23 VITALS — BP 124/76 | HR 88 | Temp 98.1°F | Ht 65.0 in | Wt 256.0 lb

## 2023-04-23 DIAGNOSIS — R632 Polyphagia: Secondary | ICD-10-CM

## 2023-04-23 DIAGNOSIS — Z6841 Body Mass Index (BMI) 40.0 and over, adult: Secondary | ICD-10-CM

## 2023-04-23 DIAGNOSIS — F5089 Other specified eating disorder: Secondary | ICD-10-CM

## 2023-04-23 NOTE — Progress Notes (Signed)
 WEIGHT SUMMARY AND BIOMETRICS  Weight Lost Since Last Visit: 0  Weight Gained Since Last Visit: 3lb   Vitals Temp: 98.1 F (36.7 C) BP: 124/76 Pulse Rate: 88 SpO2: 100 %   Anthropometric Measurements Height: 5\' 5"  (1.651 m) Weight: 256 lb (116.1 kg) BMI (Calculated): 42.6 Weight at Last Visit: 253lb Weight Lost Since Last Visit: 0 Weight Gained Since Last Visit: 3lb Starting Weight: 255lb Total Weight Loss (lbs): 0 lb (0 kg) Peak Weight: 298lb   Body Composition  Body Fat %: 48.6 % Fat Mass (lbs): 124.4 lbs Muscle Mass (lbs): 125 lbs Total Body Water (lbs): 90 lbs Visceral Fat Rating : 13   Other Clinical Data Fasting: no Labs: no Today's Visit #: 4 Starting Date: 03/19/23    OBESITY Jillian Ruiz is here to discuss her progress with her obesity treatment plan along with follow-up of her obesity related diagnoses.    Nutrition Plan: the Category 2 plan + 100 calories- 50-70% adherence.  Current exercise: walking  Interim History:  She is up 3 lbs since her last visit.  Eating all of the food on the plan., Protein intake is as prescribed, Is not skipping meals, Not journaling consistently., and Water intake is adequate.   Pharmacotherapy: Jillian Ruiz is on Rybelsus 14 mg daily before breakfast Adverse side effects: None Hunger is moderately controlled.  Cravings are moderately controlled.  Assessment/Plan:   Jillian Ruiz endorses excessive hunger.  Medication(s): Rybelsus 14 mg daily.  Effects of medication:  moderately controlled. Cravings are moderately controlled.   Plan: Medication(s): Rybelsus 14 mg daily before breakfast Will increase water, protein and fiber to help assuage hunger.  Will minimize foods that have a high glucose index/load to minimize reactive hypoglycemia.   Eating disorder/emotional eating Jillian Ruiz has had issues  with stress eating, emotional eating, and nighttime eating. Currently this is moderately controlled. Overall mood is stable. Denies suicidal/homicidal ideation. She was placed on Wellbutrin but states that it made her angry and she stopped the medications and the symptoms resolved.  Medication(s): Will stop Wellbutrin.   Plan:  Motivational interviewing as well as evidence-based interventions for health behavior change were utilized today including the discussion of self monitoring techniques, problem-solving barriers and SMART goal setting techniques.  Discussed distractions to curb eating behaviors. Discussed activities to do with one's hands in the evening  Be sure to get adequate rest as lack of rest can trigger appetite.  Have plan in place for stressful events.  Consider other rewards besides food.   Will choose healthy snacks.    Morbid Obesity: Current BMI BMI (Calculated): 42.6   Pharmacotherapy Plan Start  Rybelsus 14 mg daily before breakfast  Jillian Ruiz is currently in the action stage of change. As such, her goal is to continue with weight loss efforts.  She has agreed to the Category 2 plan plus 100 calories.   Exercise  goals: All adults should avoid inactivity. Some physical activity is better than none, and adults who participate in any amount of physical activity gain some health benefits.  Behavioral modification strategies: increasing lean protein intake, no meal skipping, meal planning , planning for success, increasing vegetables, and keep healthy foods in the home.  Jillian Ruiz has agreed to follow-up with our clinic in 2 weeks.    Objective:   VITALS: Per patient if applicable, see vitals. GENERAL: Alert and in no acute distress. CARDIOPULMONARY: No increased WOB. Speaking in clear sentences.  PSYCH: Pleasant and cooperative. Speech normal rate and rhythm. Affect is appropriate. Insight and judgement are appropriate. Attention is focused, linear, and appropriate.   NEURO: Oriented as arrived to appointment on time with no prompting.   Attestation Statements:   This was prepared with the assistance of Engineer, civil (consulting).  Occasional wrong-word or sound-a-like substitutions may have occurred due to the inherent limitations of voice recognition   Corinna Capra, DO

## 2023-05-07 ENCOUNTER — Encounter: Payer: Self-pay | Admitting: Bariatrics

## 2023-05-07 ENCOUNTER — Ambulatory Visit: Admitting: Bariatrics

## 2023-05-07 VITALS — BP 120/78 | HR 76 | Temp 98.0°F | Ht 65.0 in | Wt 259.0 lb

## 2023-05-07 DIAGNOSIS — Z6841 Body Mass Index (BMI) 40.0 and over, adult: Secondary | ICD-10-CM

## 2023-05-07 DIAGNOSIS — R632 Polyphagia: Secondary | ICD-10-CM

## 2023-05-07 DIAGNOSIS — E66813 Obesity, class 3: Secondary | ICD-10-CM

## 2023-05-07 DIAGNOSIS — F5089 Other specified eating disorder: Secondary | ICD-10-CM | POA: Diagnosis not present

## 2023-05-07 MED ORDER — TOPIRAMATE 50 MG PO TABS: 50.0000 mg | ORAL_TABLET | Freq: Every day | ORAL | 0 refills | Status: DC

## 2023-05-07 NOTE — Progress Notes (Signed)
 WEIGHT SUMMARY AND BIOMETRICS  Weight Lost Since Last Visit: 0  Weight Gained Since Last Visit: 3lb   Vitals Temp: 98 F (36.7 C) BP: 120/78 Pulse Rate: 76 SpO2: 100 %   Anthropometric Measurements Height: 5\' 5"  (1.651 m) Weight: 259 lb (117.5 kg) BMI (Calculated): 43.1 Weight at Last Visit: 256lb Weight Lost Since Last Visit: 0 Weight Gained Since Last Visit: 3lb Starting Weight: 255lb Total Weight Loss (lbs): 0 lb (0 kg) Peak Weight: 298lb   Body Composition  Body Fat %: 49.1 % Fat Mass (lbs): 127.4 lbs Muscle Mass (lbs): 125.4 lbs Total Body Water  (lbs): 93 lbs Visceral Fat Rating : 14   Other Clinical Data Fasting: no Labs: no Today's Visit #: 5 Starting Date: 03/19/23    OBESITY Chattie is here to discuss her progress with her obesity treatment plan along with follow-up of her obesity related diagnoses.    Nutrition Plan: the Category 2 plan +100 calories - 40-50% adherence.  Current exercise: walking  Interim History:  She is up 3 lbs since her last visit.  {aabnutritionassessment:29213}   Pharmacotherapy: Alease is on Rybelsus  14 mg daily before breakfast Adverse side effects: None Hunger is moderately controlled.  Cravings are poorly controlled.  Assessment/Plan:    Floretta Petro endorses excessive hunger.  Medication(s): *** Effects of medication:  {EWCONTROLASSESSMENT:24261}. Cravings are {EWCONTROLASSESSMENT:24261}.   Plan: Medication(s): {dwwpharmacotherapy:29109} Will increase water , protein and fiber to help assuage hunger.  Will minimize foods that have a high glucose index/load to minimize reactive hypoglycemia.     Morbid Obesity: Current BMI BMI (Calculated): 43.1   Pharmacotherapy Plan {dwwmed:29123}  {dwwpharmacotherapy:29109}  Shakenna {CHL AMB IS/IS NOT:210130109} currently in the action stage of  change. As such, her goal is to {MWMwtloss#1:210800005}.  She has agreed to {dwwsldiets:29085}.  Exercise goals: {MWM EXERCISE RECS:23473}  Behavioral modification strategies: {dwwslwtlossstrategies:29088}.  Lillion has agreed to follow-up with our clinic in {NUMBER 1-10:22536} weeks.     Objective:   VITALS: Per patient if applicable, see vitals. GENERAL: Alert and in no acute distress. CARDIOPULMONARY: No increased WOB. Speaking in clear sentences.  PSYCH: Pleasant and cooperative. Speech normal rate and rhythm. Affect is appropriate. Insight and judgement are appropriate. Attention is focused, linear, and appropriate.  NEURO: Oriented as arrived to appointment on time with no prompting.   Attestation Statements:     Time spent on visit in care of the patient today including the items listed below was *** minutes.    {AABTime:32652} minutes were spent talking about the history, {aabtime2025:32653} minutes for face to face counseling implementing the plan, discussing the specifics of how to arrange meals, meal planning, water  intake.   I reviewed separate health records.   I counseled the patient on the following conditions:***  I discussed with the patient the following conditions:***  Discussed the following information sheets ***.   I reviewed the  labs which were ordered from his/her visit on ***,   I independently interpreted the results of the patient lab work done at her last visit at this facility.   I spent time discussing labs obtained from Care Everywhere and discussing the results with the patient in preparation for ordering any additional lab work.    I spent time discussing labs obtained from the patient's My Chart or other site in preparation for ordering additional lab work.   I ordered the following the following labs: {aablabs:32654}  I ordered the following medications: ***. I discussed the benefits, risks, and contraindications I allowed the patient to  ask questions.   I discussed medication options focusing on the following medicine/medicines: {aabweightdiabetes:32655}  GLP-1 sheet given.   Discussed the risk, benefits, and contraindication of the medication.   Discussed birth control considerations and that one must always use a reliable form of birth control.   I discussed the following referral with the patient, ordered and placed a referral for the patient to see ***.  I additionally spent time documenting, reviewing, and checking the codes before submitting.        This was prepared with the assistance of Engineer, civil (consulting).  Occasional wrong-word or sound-a-like substitutions may have occurred due to the inherent limitations of voice recognition   Kirk Peper, DO

## 2023-05-21 ENCOUNTER — Encounter: Payer: Self-pay | Admitting: Bariatrics

## 2023-05-21 ENCOUNTER — Ambulatory Visit (INDEPENDENT_AMBULATORY_CARE_PROVIDER_SITE_OTHER): Admitting: Bariatrics

## 2023-05-21 VITALS — BP 114/75 | HR 95 | Temp 98.0°F | Ht 65.0 in | Wt 255.0 lb

## 2023-05-21 DIAGNOSIS — F5089 Other specified eating disorder: Secondary | ICD-10-CM

## 2023-05-21 DIAGNOSIS — R632 Polyphagia: Secondary | ICD-10-CM | POA: Diagnosis not present

## 2023-05-21 DIAGNOSIS — Z6841 Body Mass Index (BMI) 40.0 and over, adult: Secondary | ICD-10-CM

## 2023-05-21 MED ORDER — TOPIRAMATE 50 MG PO TABS: 50.0000 mg | ORAL_TABLET | Freq: Every day | ORAL | 0 refills | Status: DC

## 2023-05-21 NOTE — Progress Notes (Signed)
 WEIGHT SUMMARY AND BIOMETRICS  Weight Lost Since Last Visit: 4lb  Weight Gained Since Last Visit: 0   Vitals Temp: 98 F (36.7 C) BP: 114/75 Pulse Rate: 95 SpO2: 92 %   Anthropometric Measurements Height: 5\' 5"  (1.651 m) Weight: 255 lb (115.7 kg) BMI (Calculated): 42.43 Weight at Last Visit: 259lb Weight Lost Since Last Visit: 4lb Weight Gained Since Last Visit: 0 Starting Weight: 255lb Total Weight Loss (lbs): 0 lb (0 kg) Peak Weight: 298   Body Composition  Body Fat %: 48.3 % Fat Mass (lbs): 123.6 lbs Muscle Mass (lbs): 125.6 lbs Total Body Water  (lbs): 89.8 lbs Visceral Fat Rating : 13   Other Clinical Data Fasting: no Labs: no Today's Visit #: 6 Starting Date: 03/19/23    OBESITY Jillian Ruiz is here to discuss her progress with her obesity treatment plan along with follow-up of her obesity related diagnoses.    Nutrition Plan: the Category 2 plan + calories- 50% adherence.  Current exercise: walking  Interim History:  She is down 4 lbs since her last visit.  Eating all of the food on the plan., Protein intake is as prescribed, Is not skipping meals, Water  intake is adequate., and Denies polyphagia   Hunger is moderately controlled.  Cravings are moderately controlled.  Assessment/Plan:   Jillian Ruiz endorses excessive hunger.  Medication(s): none Effects of medication:  moderately controlled. Cravings are moderately controlled.   Plan: Medication(s): none. Will adhere to her diet 85 to 95 %.  Will increase water , protein and fiber to help assuage hunger.  Will minimize foods that have a high glucose index/load to minimize reactive hypoglycemia.    Eating disorder/emotional eating Jillian Ruiz has had issues with stress eating, emotional eating, and boredom eating. Currently this is moderately controlled. Overall mood is stable.  Denies suicidal/homicidal ideation. Medication(s): Topamax  50 mg BID  Plan:  Specifically regarding patient's less desirable eating habits and patterns, we employed the technique of small changes when she cannot fully commit to her prudent nutritional plan. Discussed distractions to curb eating behaviors. Discussed activities to do with one's hands in the evening. She does crochet.  Be sure to get adequate rest as lack of rest can trigger appetite.  Have plan in place for stressful events.  Consider other rewards besides food.   Will keep healthy snacks and unhealthy snacks out of the house.  Will explore healthy recipes.  Rx: Topamax  50 mg 1 p.o. daily with evening meal #30 with no refills.   Morbid Obesity: Current BMI BMI (Calculated): 42.43    Jillian Ruiz is currently in the action stage of change. As such, her goal is to continue with weight loss efforts.  She has agreed to the Category 2 planand 100 calories   Exercise goals: For substantial health benefits, adults should do at least 150 minutes (2 hours and 30 minutes) a week of moderate-intensity,  or 75 minutes (1 hour and 15 minutes) a week of vigorous-intensity aerobic physical activity, or an equivalent combination of moderate- and vigorous-intensity aerobic activity. Aerobic activity should be performed in episodes of at least 10 minutes, and preferably, it should be spread throughout the week.  Behavioral modification strategies: meal planning , increase water  intake, better snacking choices, and planning for success.  Jillian Ruiz has agreed to follow-up with our clinic in 3 weeks.     Objective:   VITALS: Per patient if applicable, see vitals. GENERAL: Alert and in no acute distress. CARDIOPULMONARY: No increased WOB. Speaking in clear sentences.  PSYCH: Pleasant and cooperative. Speech normal rate and rhythm. Affect is appropriate. Insight and judgement are appropriate. Attention is focused, linear, and appropriate.  NEURO:  Oriented as arrived to appointment on time with no prompting.   Attestation Statements:    This was prepared with the assistance of Engineer, civil (consulting).  Occasional wrong-word or sound-a-like substitutions may have occurred due to the inherent limitations of voice recognition   Kirk Peper, DO

## 2023-05-23 ENCOUNTER — Encounter: Payer: Self-pay | Admitting: Family Medicine

## 2023-06-04 ENCOUNTER — Encounter: Payer: Self-pay | Admitting: Bariatrics

## 2023-06-04 ENCOUNTER — Ambulatory Visit: Admitting: Bariatrics

## 2023-06-04 VITALS — BP 120/79 | HR 86 | Temp 98.2°F | Ht 65.0 in | Wt 257.0 lb

## 2023-06-04 DIAGNOSIS — E559 Vitamin D deficiency, unspecified: Secondary | ICD-10-CM | POA: Diagnosis not present

## 2023-06-04 DIAGNOSIS — F5089 Other specified eating disorder: Secondary | ICD-10-CM

## 2023-06-04 DIAGNOSIS — Z6841 Body Mass Index (BMI) 40.0 and over, adult: Secondary | ICD-10-CM | POA: Diagnosis not present

## 2023-06-04 MED ORDER — VITAMIN D (ERGOCALCIFEROL) 1.25 MG (50000 UNIT) PO CAPS: 50000.0000 [IU] | ORAL_CAPSULE | ORAL | 0 refills | Status: DC

## 2023-06-04 NOTE — Progress Notes (Signed)
 WEIGHT SUMMARY AND BIOMETRICS  Weight Lost Since Last Visit: 0  Weight Gained Since Last Visit: 2lb   Vitals Temp: 98.2 F (36.8 C) BP: 120/79 Pulse Rate: 86 SpO2: 100 %   Anthropometric Measurements Height: 5\' 5"  (1.651 m) Weight: 257 lb (116.6 kg) BMI (Calculated): 42.77 Weight at Last Visit: 255lb Weight Lost Since Last Visit: 0 Weight Gained Since Last Visit: 2lb Starting Weight: 255lb Total Weight Loss (lbs): 0 lb (0 kg) Peak Weight: 298lb   Body Composition  Body Fat %: 48.9 % Fat Mass (lbs): 125.8 lbs Muscle Mass (lbs): 124.8 lbs Total Body Water  (lbs): 91.2 lbs Visceral Fat Rating : 14   Other Clinical Data Fasting: no Labs: no Today's Visit #: 7 Starting Date: 03/19/23    OBESITY Wendi is here to discuss her progress with her obesity treatment plan along with follow-up of her obesity related diagnoses.    Nutrition Plan: the Category 2 plan + 100 calories- 60-70% adherence.  Current exercise: walking and kettle bells  Interim History:  She is up 2 lbs since her last visit. Her bio-impedence readings show that she is up about 2 lbs of water .  Not eating all of the food on the plan., Protein intake is as prescribed, Not journaling consistently., and Water  intake is inadequate.   Pharmacotherapy: Cniyah is on Rybelsus  14 mg daily before breakfast Adverse side effects: None Hunger is moderately controlled.  Cravings are moderately controlled.  Assessment/Plan:   Vitamin D  Deficiency Vitamin D  is not at goal of 50.  Most recent vitamin D  level was 11.1. She is on  prescription ergocalciferol  50,000 IU weekly. Lab Results  Component Value Date   VD25OH 11.1 (L) 03/19/2023    Plan: Refill prescription vitamin D  50,000 IU weekly.   Eating disorder/emotional eating Kamrin has had issues with stress eating and emotional  eating. Currently this is moderately controlled. Overall mood is stable. Denies suicidal/homicidal ideation. Medication(s): Topamax  50 mg daily with dinner  Plan:  Motivational interviewing as well as evidence-based interventions for health behavior change were utilized today including the discussion of self monitoring techniques, problem-solving barriers and SMART goal setting techniques.  She will increase her water  to at least 64 ounces and hopefully up to 80 ounces daily.  She will continue her exercise walking on a regular basis. She will increase her journaling and record her calories and protein on a regular basis Discussed distractions to curb eating behaviors. Consider other rewards besides food.     Morbid Obesity: Current BMI BMI (Calculated): 42.77   Pharmacotherapy Plan Continue  Rybelsus  14 mg daily before breakfast  Dulcy is currently in the action stage of change. As such, her goal is to continue with weight loss efforts.  She has agreed to keeping a food journal with goal of 1,300 calories and 90 grams of protein daily, or category 2 plus  100 calories.   Exercise goals: All adults should avoid inactivity. Some physical activity is better than none, and adults who participate in any amount of physical activity gain some health benefits.  Behavioral modification strategies: increasing lean protein intake, no meal skipping, meal planning , better snacking choices, planning for success, increasing fiber rich foods, avoiding temptations, and keep healthy foods in the home.  Bryttani has agreed to follow-up with our clinic in 4 weeks.    Objective:   VITALS: Per patient if applicable, see vitals. GENERAL: Alert and in no acute distress. CARDIOPULMONARY: No increased WOB. Speaking in clear sentences.  PSYCH: Pleasant and cooperative. Speech normal rate and rhythm. Affect is appropriate. Insight and judgement are appropriate. Attention is focused, linear, and appropriate.   NEURO: Oriented as arrived to appointment on time with no prompting.   Attestation Statements:   This was prepared with the assistance of Engineer, civil (consulting).  Occasional wrong-word or sound-a-like substitutions may have occurred due to the inherent limitations of voice recognition   Kirk Peper, DO

## 2023-06-20 ENCOUNTER — Ambulatory Visit: Admitting: Family Medicine

## 2023-06-20 ENCOUNTER — Encounter: Payer: Self-pay | Admitting: Family Medicine

## 2023-06-20 VITALS — BP 112/72 | HR 71 | Temp 98.4°F | Resp 18 | Ht 65.0 in | Wt 258.6 lb

## 2023-06-20 DIAGNOSIS — Z1159 Encounter for screening for other viral diseases: Secondary | ICD-10-CM | POA: Diagnosis not present

## 2023-06-20 DIAGNOSIS — R399 Unspecified symptoms and signs involving the genitourinary system: Secondary | ICD-10-CM | POA: Diagnosis not present

## 2023-06-20 DIAGNOSIS — Z118 Encounter for screening for other infectious and parasitic diseases: Secondary | ICD-10-CM | POA: Diagnosis not present

## 2023-06-20 LAB — POCT URINALYSIS DIP (CLINITEK)
Bilirubin, UA: NEGATIVE
Blood, UA: NEGATIVE
Glucose, UA: NEGATIVE mg/dL
Ketones, POC UA: NEGATIVE mg/dL
Leukocytes, UA: NEGATIVE
Nitrite, UA: NEGATIVE
Spec Grav, UA: >=1.03 — AB (ref 1.010–1.025)
Urobilinogen, UA: 0.2 U/dL
pH, UA: 5.5 (ref 5.0–8.0)

## 2023-06-20 MED ORDER — CIPROFLOXACIN HCL 250 MG PO TABS: 250.0000 mg | ORAL_TABLET | Freq: Two times a day (BID) | ORAL | 0 refills | Status: AC

## 2023-06-20 NOTE — Progress Notes (Signed)
 Acute Office Visit  Subjective:     Patient ID: Jillian Ruiz, female    DOB: 1990/01/30, 33 y.o.   MRN: [id number]  Chief Complaint  Patient presents with   Urinary Tract Infection    Patient states that her symptoms started Sunday with urgency, frequency, and tingling feeling while urinating     Urinary Tract Infection  Associated symptoms include frequency and urgency. Pertinent negatives include no flank pain or hematuria.  Patient is in today for acute visit.  Pt is new to me  She is here for UTI symptoms consisting of urgency, frequency, and tingling while urinating. This started on Sunday. She also reports urinary hesitancy. No cramps. No vaginal discharge. She is sexually active. She denies flank pain.    Review of Systems  Genitourinary:  Positive for dysuria, frequency and urgency. Negative for flank pain and hematuria.  All other systems reviewed and are negative.      Objective:    BP 112/72   Pulse 71   Temp 98.4 F (36.9 C) (Oral)   Resp 18   Ht 5\' 5"  (1.651 m)   Wt 258 lb 9.6 oz (117.3 kg)   SpO2 98%   BMI 43.03 kg/m  BP Readings from Last 3 Encounters:  06/20/23 112/72  06/04/23 120/79  05/21/23 114/75      Physical Exam Vitals and nursing note reviewed.  Constitutional:      Appearance: Normal appearance. She is obese.  HENT:     Head: Normocephalic and atraumatic.     Right Ear: External ear normal.     Left Ear: External ear normal.     Nose: Nose normal.     Mouth/Throat:     Mouth: Mucous membranes are moist.     Pharynx: Oropharynx is clear.  Eyes:     Conjunctiva/sclera: Conjunctivae normal.     Pupils: Pupils are equal, round, and reactive to light.  Cardiovascular:     Rate and Rhythm: Normal rate.  Pulmonary:     Effort: Pulmonary effort is normal.  Abdominal:     General: Abdomen is flat. Bowel sounds are normal. There is no distension.     Tenderness: There is no abdominal tenderness. There is no right CVA tenderness,  left CVA tenderness, guarding or rebound.  Skin:    General: Skin is warm.     Capillary Refill: Capillary refill takes less than 2 seconds.  Neurological:     General: No focal deficit present.     Mental Status: She is alert and oriented to person, place, and time. Mental status is at baseline.  Psychiatric:        Mood and Affect: Mood normal.        Behavior: Behavior normal.        Thought Content: Thought content normal.        Judgment: Judgment normal.   No results found for any visits on 06/20/23.      Assessment & Plan:   Problem List Items Addressed This Visit   None Visit Diagnoses       UTI symptoms    -  Primary   Relevant Orders   POCT URINALYSIS DIP (CLINITEK)     UTI symptoms -     POCT URINALYSIS DIP (CLINITEK) -     Ciprofloxacin HCl; Take 1 tablet (250 mg total) by mouth 2 (two) times daily for 3 days.  Dispense: 6 tablet; Refill: 0 -     Chlamydia/Gonococcus/Trichomonas, NAA  Screening  for viral and chlamydial diseases -     Chlamydia/Gonococcus/Trichomonas, NAA   Pt with UTI symptoms. Urine dip negative. Will treat empirically with Cipro for 3 days and send for std screening. Follow up on results.   No orders of the defined types were placed in this encounter.   No follow-ups on file.  Manette Section, MD

## 2023-06-22 LAB — CHLAMYDIA/GONOCOCCUS/TRICHOMONAS, NAA
Chlamydia by NAA: NEGATIVE
Gonococcus by NAA: NEGATIVE
Trich vag by NAA: NEGATIVE

## 2023-06-24 ENCOUNTER — Ambulatory Visit: Payer: Self-pay | Admitting: Family Medicine

## 2023-06-25 ENCOUNTER — Ambulatory Visit (INDEPENDENT_AMBULATORY_CARE_PROVIDER_SITE_OTHER): Admitting: Family Medicine

## 2023-06-25 VITALS — BP 102/70 | HR 96 | Temp 98.8°F | Resp 18 | Ht 65.0 in | Wt 265.8 lb

## 2023-06-25 DIAGNOSIS — Z6841 Body Mass Index (BMI) 40.0 and over, adult: Secondary | ICD-10-CM

## 2023-06-25 DIAGNOSIS — G8929 Other chronic pain: Secondary | ICD-10-CM | POA: Diagnosis not present

## 2023-06-25 DIAGNOSIS — M545 Low back pain, unspecified: Secondary | ICD-10-CM

## 2023-06-25 NOTE — Patient Instructions (Signed)
 Lidocaine pain patches over the counter Aleve or Voltaren gel over the counter with massage gun

## 2023-06-25 NOTE — Progress Notes (Signed)
 Established Patient Office Visit  Subjective   Patient ID: Jillian Ruiz, female    DOB: 1990/10/27  Age: 33 y.o. MRN: [id number]  Chief Complaint  Patient presents with   Tailbone Pain    Patient is here to  discuss lower back pain that she has been having for a while. She states that prior she did have imaging done but it did not show anything. She also states that the pain is causing her not to sleep well. Pain is located only near tailbone. Patient does see healthy weight and wellness due to weight and states that she isn't seeing any results at this time and she knows that her weight it what is causing her lower back pain.     HPI  Pt was seen last week for UTI symptoms. Given Cipro . She reports she was good until last weekend. She bought an all natural soap at [employer] and symptoms returned. She thinks it's the soap that caused her symptoms.  She has had back pain for the last 2 years. Located at her lower back and top of her butt-bone. She says when her back hurts, she can't work out. She was seeing her previous PCP and given her Flexeril  5mg  at night. Pain worse with walking, standing for long period of times. She takes Tylenol  650mg  2 tabs TID prn.   She is on Rybelsus  14 mg daily for weight management for the last year. Her insurance wouldn't pay for her injections. She is seeing [facility] since March. She is on 'plan b' 1200 calories, 90 gm of protein.  She reports she's not losing weight. The Rybelsus  causes her not to want to eat; so it's hard getting the protein in.  Review of Systems  Constitutional:        Weight gain  Musculoskeletal:  Positive for back pain.  All other systems reviewed and are negative.    Objective:     BP 102/70   Pulse 96   Temp 98.8 F (37.1 C) (Oral)   Resp 18   Ht 5\' 5"  (1.651 m)   Wt 265 lb 12.8 oz (120.6 kg)   SpO2 99%   BMI 44.23 kg/m  BP Readings from Last 3 Encounters:  06/25/23 102/70  06/20/23 112/72  06/04/23 120/79       Physical Exam Vitals and nursing note reviewed.  Constitutional:      Appearance: Normal appearance. She is normal weight.  HENT:     Head: Normocephalic and atraumatic.     Right Ear: External ear normal.     Left Ear: External ear normal.     Nose: Nose normal.     Mouth/Throat:     Mouth: Mucous membranes are moist.     Pharynx: Oropharynx is clear.  Eyes:     Conjunctiva/sclera: Conjunctivae normal.     Pupils: Pupils are equal, round, and reactive to light.  Cardiovascular:     Rate and Rhythm: Normal rate.  Pulmonary:     Effort: Pulmonary effort is normal.  Skin:    General: Skin is warm.     Capillary Refill: Capillary refill takes less than 2 seconds.  Neurological:     General: No focal deficit present.     Mental Status: She is alert and oriented to person, place, and time. Mental status is at baseline.  Psychiatric:        Mood and Affect: Mood normal.        Behavior: Behavior normal.  Thought Content: Thought content normal.        Judgment: Judgment normal.    No results found for any visits on 06/25/23.  Last hemoglobin A1c Lab Results  Component Value Date   HGBA1C 5.6 03/19/2023      The ASCVD Risk score (Arnett DK, et al., 2019) failed to calculate for the following reasons:   The 2019 ASCVD risk score is only valid for ages 64 to 20    Assessment & Plan:   Problem List Items Addressed This Visit   None  Chronic midline low back pain without sciatica -     DG Lumbar Spine Complete; Future  BMI 40.0-44.9, adult (HCC)   Pt with chronic low back pain. Refer for imaging. Continue OTC pain relievers and Flexeril . Added back exercises. F/u pending results from imaging. Discussed diet and exercise counseling. Continue f/u with [facility] and given handout on protein list foods.  No follow-ups on file.    Manette Section, MD

## 2023-06-26 ENCOUNTER — Ambulatory Visit: Payer: Self-pay | Admitting: Family Medicine

## 2023-06-26 ENCOUNTER — Ambulatory Visit

## 2023-06-26 DIAGNOSIS — M545 Low back pain, unspecified: Secondary | ICD-10-CM

## 2023-06-26 DIAGNOSIS — G8929 Other chronic pain: Secondary | ICD-10-CM | POA: Diagnosis not present

## 2023-07-03 ENCOUNTER — Ambulatory Visit: Admitting: Bariatrics

## 2023-07-03 ENCOUNTER — Encounter: Payer: Self-pay | Admitting: Bariatrics

## 2023-07-03 VITALS — BP 123/80 | HR 100 | Temp 98.2°F | Ht 65.0 in | Wt 260.0 lb

## 2023-07-03 DIAGNOSIS — E66813 Obesity, class 3: Secondary | ICD-10-CM | POA: Diagnosis not present

## 2023-07-03 DIAGNOSIS — R632 Polyphagia: Secondary | ICD-10-CM

## 2023-07-03 DIAGNOSIS — F5089 Other specified eating disorder: Secondary | ICD-10-CM

## 2023-07-03 DIAGNOSIS — Z6841 Body Mass Index (BMI) 40.0 and over, adult: Secondary | ICD-10-CM

## 2023-07-03 MED ORDER — TOPIRAMATE 50 MG PO TABS: 50.0000 mg | ORAL_TABLET | Freq: Every day | ORAL | 0 refills | Status: DC

## 2023-07-03 NOTE — Progress Notes (Signed)
 WEIGHT SUMMARY AND BIOMETRICS  Weight Lost Since Last Visit: 0  Weight Gained Since Last Visit: 3lb   Vitals Temp: 98.2 F (36.8 C) BP: 123/80 Pulse Rate: 100 SpO2: 100 %   Anthropometric Measurements Height: 5' 5 (1.651 m) Weight: 260 lb (117.9 kg) BMI (Calculated): 43.27 Weight at Last Visit: 257lb Weight Lost Since Last Visit: 0 Weight Gained Since Last Visit: 3lb Starting Weight: 255lb Total Weight Loss (lbs): 0 lb (0 kg) Peak Weight: 298lb   Body Composition  Body Fat %: 49.1 % Fat Mass (lbs): 128 lbs Muscle Mass (lbs): 126 lbs Total Body Water  (lbs): 90.4 lbs Visceral Fat Rating : 14   Other Clinical Data Fasting: no Labs: no Today's Visit #: 8 Starting Date: 03/19/23    OBESITY Jillian Ruiz is here to discuss her progress with her obesity treatment plan along with follow-up of her obesity related diagnoses.    Nutrition Plan: the Category 2 plan + 100 calories- 20-30% adherence.  Current exercise: weightlifting and swimming  Interim History:  She is up 3 lbs since her last visit.  Eating all of the food on the plan., Protein intake is less than prescribed., Is skipping meals, Meeting protein goals., and Water  intake is adequate.   Hunger is moderately controlled.  Cravings are moderately controlled.  Assessment/Plan:   Eating disorder/emotional eating Jillian Ruiz has had issues with stress eating, emotional eating, and nighttime eating. Currently this is moderately controlled. Overall mood is stable. Denies suicidal/homicidal ideation. Medication(s): Topamax  50 mg daily with dinner  Plan:  Motivational interviewing as well as evidence-based interventions for health behavior change were utilized today including the discussion of self monitoring techniques, problem-solving barriers and SMART goal setting techniques.  She will start to journal  on a regular basis.  Discussed distractions to curb eating behaviors. Discussed activities to do with one's hands in the evening  Be sure to get adequate rest as lack of rest can trigger appetite.  Have plan in place for stressful events.  Consider other rewards besides food.    Polyphagia Jillian Ruiz does not endorse excessive hunger at this time.  Medication(s): none Appetite:  well controlled. Cravings are moderately controlled.   Plan: Medication(s): Topamax  50 mg 1 po with her evening meal.  Will increase water , protein and fiber to help assuage hunger.  Will minimize foods that have a high glucose index/load to minimize reactive hypoglycemia.    Morbid Obesity: Current BMI BMI (Calculated): 43.27    Jillian Ruiz is currently in the action stage of change. As such, her goal is to continue with weight loss efforts.  She has agreed to the Category 2 plan, with lunch options, and low carbohydrate plan.  Exercise goals: All adults should avoid inactivity. Some physical activity is better than none, and adults who participate in any amount of physical activity gain some health benefits.  Behavioral modification strategies:  increasing lean protein intake, meal planning , increase water  intake, better snacking choices, planning for success, increasing vegetables, and avoiding temptations.  Jillian Ruiz has agreed to follow-up with our clinic in 3 weeks.     Objective:   VITALS: Per patient if applicable, see vitals. GENERAL: Alert and in no acute distress. CARDIOPULMONARY: No increased WOB. Speaking in clear sentences.  PSYCH: Pleasant and cooperative. Speech normal rate and rhythm. Affect is appropriate. Insight and judgement are appropriate. Attention is focused, linear, and appropriate.  NEURO: Oriented as arrived to appointment on time with no prompting.   Attestation Statements:   This was prepared with the assistance of Engineer, civil (consulting).  Occasional wrong-word or sound-a-like  substitutions may have occurred due to the inherent limitations of voice recognition   Kirk Peper, DO

## 2023-07-31 ENCOUNTER — Ambulatory Visit: Admitting: Bariatrics

## 2023-07-31 ENCOUNTER — Encounter: Payer: Self-pay | Admitting: Bariatrics

## 2023-07-31 VITALS — BP 124/76 | HR 77 | Temp 97.8°F | Ht 65.0 in | Wt 257.0 lb

## 2023-07-31 DIAGNOSIS — Z6841 Body Mass Index (BMI) 40.0 and over, adult: Secondary | ICD-10-CM

## 2023-07-31 DIAGNOSIS — F5089 Other specified eating disorder: Secondary | ICD-10-CM

## 2023-07-31 DIAGNOSIS — E559 Vitamin D deficiency, unspecified: Secondary | ICD-10-CM

## 2023-07-31 MED ORDER — TOPIRAMATE 50 MG PO TABS: 50.0000 mg | ORAL_TABLET | Freq: Every day | ORAL | 0 refills | Status: DC

## 2023-07-31 MED ORDER — RYBELSUS 14 MG PO TABS: 14.0000 mg | ORAL_TABLET | Freq: Every day | ORAL | 3 refills | Status: DC

## 2023-07-31 NOTE — Progress Notes (Signed)
 WEIGHT SUMMARY AND BIOMETRICS  Weight Lost Since Last Visit: 3lb  Weight Gained Since Last Visit: 0   Vitals Temp: 97.8 F (36.6 C) BP: 124/76 Pulse Rate: 77 SpO2: 99 %   Anthropometric Measurements Height: 5' 5 (1.651 m) Weight: 257 lb (116.6 kg) BMI (Calculated): 42.77 Weight at Last Visit: 260lb Weight Lost Since Last Visit: 3lb Weight Gained Since Last Visit: 0 Starting Weight: 255lb Total Weight Loss (lbs): 0 lb (0 kg) Peak Weight: 298lb   Body Composition  Body Fat %: 46.8 % Fat Mass (lbs): 120.4 lbs Muscle Mass (lbs): 130 lbs Total Body Water  (lbs): 90.4 lbs Visceral Fat Rating : 13   Other Clinical Data Fasting: no Labs: no Today's Visit #: 9 Starting Date: 03/19/23    OBESITY Bree is here to discuss her progress with her obesity treatment plan along with follow-up of her obesity related diagnoses.    Nutrition Plan: the Category 2 plan + 100-  50-60% adherence.  Current exercise: walking and goes to the gym  Interim History:  She is down another 3 lbs since her last visitl  Eating all of the food on the plan., Protein intake is as prescribed, and Water  intake is adequate.   Pharmacotherapy: Tayloranne is on Rybelsus  14 mg daily before breakfast Adverse side effects: None Hunger is moderately controlled.  Cravings are moderately controlled.  Assessment/Plan:   Vitamin D  Deficiency Vitamin D  is not at goal of 50.  Most recent vitamin D  level was 11.1. She is on  prescription ergocalciferol  50,000 IU weekly. She states that her energy has steady.  Lab Results  Component Value Date   VD25OH 11.1 (L) 03/19/2023    Plan: Refill prescription vitamin D  50,000 IU weekly.   Eating disorder/emotional eating Kristina has had issues with stress eating, emotional eating, and boredom eating. Currently this is moderately controlled.  Overall mood is stable. Denies suicidal/homicidal ideation. Medication(s): Topamax  50 mg daily with dinner  Plan:  Motivational interviewing as well as evidence-based interventions for health behavior change were utilized today including the discussion of self monitoring techniques, problem-solving barriers and SMART goal setting techniques.  Discussed distractions to curb eating behaviors. Discussed activities to do with one's hands in the evening(crocheting on a regular basis).  Be sure to get adequate rest as lack of rest can trigger appetite.  Consider other rewards besides food.   Discussed eating out and handout given.    Morbid Obesity: Current BMI BMI (Calculated): 42.77   Pharmacotherapy Plan Continue and refill  Rybelsus  14 mg daily before breakfast  Retaj is currently in the action stage of change. As such, her goal is to continue with weight loss efforts.  She has agreed to the Category 2 plan + 100 calories.   Exercise goals: For substantial health benefits, adults should do at least 150 minutes (2 hours and 30 minutes) a  week of moderate-intensity, or 75 minutes (1 hour and 15 minutes) a week of vigorous-intensity aerobic physical activity, or an equivalent combination of moderate- and vigorous-intensity aerobic activity. Aerobic activity should be performed in episodes of at least 10 minutes, and preferably, it should be spread throughout the week. She will continue her workout 3 x per week doing both cardiac and resistance.   Behavioral modification strategies: increasing lean protein intake, decreasing simple carbohydrates , no meal skipping, decrease eating out, meal planning , increase water  intake, better snacking choices, planning for success, keep healthy foods in the home, weigh protein portions, and mindful eating.  Allice has agreed to follow-up with our clinic in 4 weeks.   Objective:   VITALS: Per patient if applicable, see vitals. GENERAL: Alert and in no  acute distress. CARDIOPULMONARY: No increased WOB. Speaking in clear sentences.  PSYCH: Pleasant and cooperative. Speech normal rate and rhythm. Affect is appropriate. Insight and judgement are appropriate. Attention is focused, linear, and appropriate.  NEURO: Oriented as arrived to appointment on time with no prompting.   Attestation Statements:   This was prepared with the assistance of Engineer, civil (consulting).  Occasional wrong-word or sound-a-like substitutions may have occurred due to the inherent limitations of voice recognition   Clayborne Daring, DO

## 2023-08-09 DIAGNOSIS — F331 Major depressive disorder, recurrent, moderate: Secondary | ICD-10-CM | POA: Diagnosis not present

## 2023-08-09 DIAGNOSIS — F411 Generalized anxiety disorder: Secondary | ICD-10-CM | POA: Diagnosis not present

## 2023-08-21 DIAGNOSIS — F411 Generalized anxiety disorder: Secondary | ICD-10-CM | POA: Diagnosis not present

## 2023-08-27 ENCOUNTER — Ambulatory Visit: Admitting: Bariatrics

## 2023-08-27 ENCOUNTER — Encounter: Payer: Self-pay | Admitting: Bariatrics

## 2023-08-27 VITALS — BP 107/71 | HR 87 | Temp 98.5°F | Ht 65.0 in | Wt 263.0 lb

## 2023-08-27 DIAGNOSIS — E559 Vitamin D deficiency, unspecified: Secondary | ICD-10-CM

## 2023-08-27 DIAGNOSIS — Z6841 Body Mass Index (BMI) 40.0 and over, adult: Secondary | ICD-10-CM | POA: Diagnosis not present

## 2023-08-27 DIAGNOSIS — E669 Obesity, unspecified: Secondary | ICD-10-CM

## 2023-08-27 DIAGNOSIS — E66813 Obesity, class 3: Secondary | ICD-10-CM

## 2023-08-27 DIAGNOSIS — R632 Polyphagia: Secondary | ICD-10-CM

## 2023-08-27 MED ORDER — VITAMIN D (ERGOCALCIFEROL) 1.25 MG (50000 UNIT) PO CAPS
50000.0000 [IU] | ORAL_CAPSULE | ORAL | 0 refills | Status: DC
Start: 2023-08-27 — End: 2023-09-19

## 2023-08-27 MED ORDER — WEGOVY 0.5 MG/0.5ML ~~LOC~~ SOAJ
0.5000 mg | SUBCUTANEOUS | 0 refills | Status: DC
Start: 2023-08-27 — End: 2023-09-19

## 2023-08-27 NOTE — Progress Notes (Signed)
 WEIGHT SUMMARY AND BIOMETRICS  Weight Lost Since Last Visit: 0  Weight Gained Since Last Visit: 6lb   Vitals Temp: 98.5 F (36.9 C) BP: 107/71 Pulse Rate: 87 SpO2: 100 %   Anthropometric Measurements Height: 5' 5 (1.651 m) Weight: 263 lb (119.3 kg) BMI (Calculated): 43.77 Weight at Last Visit: 257lb Weight Lost Since Last Visit: 0 Weight Gained Since Last Visit: 6lb Starting Weight: 255lb Total Weight Loss (lbs): 0 lb (0 kg) Peak Weight: 298lb   Body Composition  Body Fat %: 48 % Fat Mass (lbs): 126.6 lbs Muscle Mass (lbs): 130 lbs Total Body Water  (lbs): 93.8 lbs Visceral Fat Rating : 14   Other Clinical Data Fasting: no Labs: no Today's Visit #: 10 Starting Date: 03/19/23    OBESITY Jillian Ruiz is here to discuss her progress with her obesity treatment plan along with follow-up of her obesity related diagnoses.    Nutrition Plan: the Category 4 plan - 60-70% adherence.  Current exercise: walking  Interim History:  She is up 6 lbs since her last visit.  Eating all of the food on the plan., Protein intake is as prescribed, and Reports polyphagia   Pharmacotherapy: Jillian Ruiz is on Rybelsus  14 mg daily before breakfast Adverse side effects: None, occasional nausea. Hunger is moderately controlled.  Cravings are moderately controlled.  Assessment/Plan:   Jillian Ruiz endorses excessive hunger.  Medication(s): Rybelsus  14 mg Effects of medication (appetite) :  moderately controlled. Cravings are moderately controlled.   Plan: Medication(s): Wegovy  0.50 mg SQ weekly Will increase water , protein and fiber to help assuage hunger.  Will minimize foods that have a high glucose index/load to minimize reactive hypoglycemia.   Vitamin D  Deficiency Vitamin D  is not at goal of 50.  Most recent vitamin D  level was 11.1. She is on  prescription  ergocalciferol  50,000 IU weekly. Lab Results  Component Value Date   VD25OH 11.1 (L) 03/19/2023    Plan: Refill prescription vitamin D  50,000 IU weekly.    Generalized Obesity: Current BMI BMI (Calculated): 43.77   Pharmacotherapy Plan Start  Wegovy  0.50 mg SQ weekly , Will stop the Rybelsus .   Jillian Ruiz is currently in the action stage of change. As such, her goal is to continue with weight loss efforts.  She has agreed to the Category 2 plan+ 100 calories.   Exercise goals: All adults should avoid inactivity. Some physical activity is better than none, and adults who participate in any amount of physical activity gain some health benefits.  Behavioral modification strategies: increasing lean protein intake, no meal skipping, meal planning , better snacking choices, planning for success, increasing vegetables, decrease snacking , avoiding temptations, weigh protein portions, work on smaller portions, and mindful eating.  Jillian Ruiz has agreed to follow-up with our clinic in 4 weeks. She needs fasting labs.    Objective:   VITALS: Per patient if applicable,  see vitals. GENERAL: Alert and in no acute distress. CARDIOPULMONARY: No increased WOB. Speaking in clear sentences.  PSYCH: Pleasant and cooperative. Speech normal rate and rhythm. Affect is appropriate. Insight and judgement are appropriate. Attention is focused, linear, and appropriate.  NEURO: Oriented as arrived to appointment on time with no prompting.   Attestation Statements:   This was prepared with the assistance of Engineer, civil (consulting).  Occasional wrong-word or sound-a-like substitutions may have occurred due to the inherent limitations of voice recognition   Clayborne Daring, DO

## 2023-08-28 ENCOUNTER — Telehealth: Payer: Self-pay | Admitting: *Deleted

## 2023-08-28 NOTE — Telephone Encounter (Signed)
 Prior authorization done via cover my meds for patients. Wegovy. Waiting on determination.

## 2023-09-02 NOTE — Telephone Encounter (Signed)
 Prior authorization approved for Wegovy . Patient notified.

## 2023-09-17 ENCOUNTER — Encounter: Payer: Self-pay | Admitting: Sports Medicine

## 2023-09-18 DIAGNOSIS — F411 Generalized anxiety disorder: Secondary | ICD-10-CM | POA: Diagnosis not present

## 2023-09-19 ENCOUNTER — Encounter: Payer: Self-pay | Admitting: Bariatrics

## 2023-09-19 ENCOUNTER — Ambulatory Visit (INDEPENDENT_AMBULATORY_CARE_PROVIDER_SITE_OTHER): Admitting: Bariatrics

## 2023-09-19 VITALS — BP 106/76 | HR 69 | Temp 97.7°F | Ht 65.0 in | Wt 256.0 lb

## 2023-09-19 DIAGNOSIS — E7800 Pure hypercholesterolemia, unspecified: Secondary | ICD-10-CM

## 2023-09-19 DIAGNOSIS — R632 Polyphagia: Secondary | ICD-10-CM

## 2023-09-19 DIAGNOSIS — Z6841 Body Mass Index (BMI) 40.0 and over, adult: Secondary | ICD-10-CM

## 2023-09-19 DIAGNOSIS — E66813 Obesity, class 3: Secondary | ICD-10-CM

## 2023-09-19 DIAGNOSIS — E559 Vitamin D deficiency, unspecified: Secondary | ICD-10-CM | POA: Diagnosis not present

## 2023-09-19 MED ORDER — WEGOVY 0.5 MG/0.5ML ~~LOC~~ SOAJ: 0.5000 mg | SUBCUTANEOUS | 0 refills | Status: DC

## 2023-09-19 MED ORDER — VITAMIN D (ERGOCALCIFEROL) 1.25 MG (50000 UNIT) PO CAPS: 50000.0000 [IU] | ORAL_CAPSULE | ORAL | 0 refills | Status: DC

## 2023-09-19 NOTE — Progress Notes (Signed)
 WEIGHT SUMMARY AND BIOMETRICS  Weight Lost Since Last Visit: 7lb  Weight Gained Since Last Visit: 0   Vitals Temp: 97.7 F (36.5 C) BP: 106/76 Pulse Rate: 69 SpO2: 100 %   Anthropometric Measurements Height: 5' 5 (1.651 m) Weight: 256 lb (116.1 kg) BMI (Calculated): 42.6 Weight at Last Visit: 263lb Weight Lost Since Last Visit: 7lb Weight Gained Since Last Visit: 0 Starting Weight: 255lb Total Weight Loss (lbs): 0 lb (0 kg) Peak Weight: 298lb   Body Composition  Body Fat %: 48.2 % Fat Mass (lbs): 123.8 lbs Muscle Mass (lbs): 126.2 lbs Total Body Water  (lbs): 87.4 lbs Visceral Fat Rating : 13   Other Clinical Data Fasting: yes Labs: yes Today's Visit #: 11 Starting Date: 03/19/23    OBESITY Julyanna is here to discuss her progress with her obesity treatment plan along with follow-up of her obesity related diagnoses.    Nutrition Plan: the Category 4 plan - 75-80% adherence.  Current exercise: walking  Interim History:  She is down another 7 lbs. She has increased her exercise.  Eating all of the food on the plan., Protein intake is as prescribed, Is not skipping meals, and Water  intake is adequate.   Pharmacotherapy: Saiya is on Wegovy  0.50 mg SQ weekly Adverse side effects: None Hunger is moderately controlled.  Cravings are moderately controlled.  Assessment/Plan:   Vitamin D  Deficiency Vitamin D  is not at goal of 50.  Most recent vitamin D  level was 11.1. She is on  prescription ergocalciferol  50,000 IU weekly. Lab Results  Component Value Date   VD25OH 11.1 (L) 03/19/2023    Plan: Refill prescription vitamin D  50,000 IU weekly.   Polyphagia Carlia endorses excessive hunger.  Medication(s): Wegovy   Effects of medication:  moderately controlled. Cravings are moderately controlled.   Plan: Medication(s): Wegovy  0.50 mg SQ  weekly Will increase water , protein and fiber to help assuage hunger.  Will minimize foods that have a high glucose index/load to minimize reactive hypoglycemia.   Elevated LDL cholesterol level:   Her last lipid panel was slightly elevated.  She is not taking any medications for cholesterol secondary to age and risk factors.  Plan:  Will check a lipid panel today. Will continue on her plan and exercise.  Labs done today (CMP, Lipids, vitamin D  ).    Morbid Obesity: Current BMI BMI (Calculated): 42.6   Pharmacotherapy Plan Continue and refill  Wegovy  0.50 mg SQ weekly  Julionna is currently in the action stage of change. As such, her goal is to continue with weight loss efforts.  She has agreed to the Category 1 plan, the Category 2 plan, the Category 4 plan, and journaling breakfast with goal of 1,700 to 1,800 calories and 120 grams of protein.  Exercise goals: All adults should avoid inactivity. Some physical activity is better than none, and adults who participate in  any amount of physical activity gain some health benefits.  Behavioral modification strategies: increasing lean protein intake, decreasing simple carbohydrates , no meal skipping, meal planning , increase water  intake, better snacking choices, planning for success, increasing vegetables, decrease snacking , avoiding temptations, weigh protein portions, measure portion sizes, and mindful eating.  Elowen has agreed to follow-up with our clinic in 4 weeks.   Objective:   VITALS: Per patient if applicable, see vitals. GENERAL: Alert and in no acute distress. CARDIOPULMONARY: No increased WOB. Speaking in clear sentences.  PSYCH: Pleasant and cooperative. Speech normal rate and rhythm. Affect is appropriate. Insight and judgement are appropriate. Attention is focused, linear, and appropriate.  NEURO: Oriented as arrived to appointment on time with no prompting.   Attestation Statements:   This was prepared with the  assistance of Engineer, civil (consulting).  Occasional wrong-word or sound-a-like substitutions may have occurred due to the inherent limitations of voice recognition   Clayborne Daring, DO

## 2023-09-20 LAB — COMPREHENSIVE METABOLIC PANEL WITH GFR
ALT: 12 IU/L (ref 0–32)
AST: 17 IU/L (ref 0–40)
Albumin: 4.3 g/dL (ref 3.9–4.9)
Alkaline Phosphatase: 60 IU/L (ref 44–121)
BUN/Creatinine Ratio: 13 (ref 9–23)
BUN: 9 mg/dL (ref 6–20)
Bilirubin Total: 0.3 mg/dL (ref 0.0–1.2)
CO2: 21 mmol/L (ref 20–29)
Calcium: 9.7 mg/dL (ref 8.7–10.2)
Chloride: 100 mmol/L (ref 96–106)
Creatinine, Ser: 0.7 mg/dL (ref 0.57–1.00)
Globulin, Total: 3.6 g/dL (ref 1.5–4.5)
Glucose: 81 mg/dL (ref 70–99)
Potassium: 4.6 mmol/L (ref 3.5–5.2)
Sodium: 137 mmol/L (ref 134–144)
Total Protein: 7.9 g/dL (ref 6.0–8.5)
eGFR: 116 mL/min/1.73 (ref >59)

## 2023-09-20 LAB — LIPID PANEL WITH LDL/HDL RATIO
Cholesterol, Total: 183 mg/dL (ref 100–199)
HDL: 49 mg/dL (ref >39)
LDL Chol Calc (NIH): 123 mg/dL — ABNORMAL HIGH (ref 0–99)
LDL/HDL Ratio: 2.5 ratio (ref 0.0–3.2)
Triglycerides: 59 mg/dL (ref 0–149)
VLDL Cholesterol Cal: 11 mg/dL (ref 5–40)

## 2023-09-20 LAB — VITAMIN D 25 HYDROXY (VIT D DEFICIENCY, FRACTURES): Vit D, 25-Hydroxy: 56.4 ng/mL (ref 30.0–100.0)

## 2023-10-03 DIAGNOSIS — F411 Generalized anxiety disorder: Secondary | ICD-10-CM | POA: Diagnosis not present

## 2023-10-23 ENCOUNTER — Ambulatory Visit: Admitting: Bariatrics

## 2023-10-23 ENCOUNTER — Other Ambulatory Visit: Payer: Self-pay | Admitting: Bariatrics

## 2023-10-23 ENCOUNTER — Encounter: Payer: Self-pay | Admitting: Bariatrics

## 2023-10-23 VITALS — BP 121/71 | HR 92 | Temp 97.9°F | Ht 65.0 in | Wt 258.0 lb

## 2023-10-23 DIAGNOSIS — E7800 Pure hypercholesterolemia, unspecified: Secondary | ICD-10-CM | POA: Diagnosis not present

## 2023-10-23 DIAGNOSIS — R632 Polyphagia: Secondary | ICD-10-CM

## 2023-10-23 DIAGNOSIS — Z6841 Body Mass Index (BMI) 40.0 and over, adult: Secondary | ICD-10-CM

## 2023-10-23 DIAGNOSIS — E66813 Obesity, class 3: Secondary | ICD-10-CM

## 2023-10-23 MED ORDER — WEGOVY 1 MG/0.5ML ~~LOC~~ SOAJ: 1.0000 mg | SUBCUTANEOUS | 0 refills | Status: DC

## 2023-10-23 NOTE — Progress Notes (Signed)
 WEIGHT SUMMARY AND BIOMETRICS  Weight Lost Since Last Visit: 0  Weight Gained Since Last Visit: 2lb   Vitals Temp: 97.9 F (36.6 C) BP: 121/71 Pulse Rate: 92 SpO2: 100 %   Anthropometric Measurements Height: 5' 5 (1.651 m) Weight: 258 lb (117 kg) BMI (Calculated): 42.93 Weight at Last Visit: 256lb Weight Lost Since Last Visit: 0 Weight Gained Since Last Visit: 2lb Starting Weight: 255lb Total Weight Loss (lbs): 0 lb (0 kg) Peak Weight: 298lb   Body Composition  Body Fat %: 47.2 % Fat Mass (lbs): 121.8 lbs Muscle Mass (lbs): 129.6 lbs Total Body Water  (lbs): 89.8 lbs Visceral Fat Rating : 13   Other Clinical Data Fasting: no Labs: no Today's Visit #: 12 Starting Date: 03/19/23    OBESITY Jillian Ruiz is here to discuss her progress with her obesity treatment plan along with follow-up of her obesity related diagnoses.    Nutrition Plan: the Category 4 plan - 80% adherence.  Current exercise: walking  Interim History:  She is up 2 lbs since her last visit.  Eating all of the food on the plan., Protein intake is as prescribed, Is skipping meals, and Water  intake is adequate.   Pharmacotherapy: Cyera is on Wegovy  0.50 mg SQ weekly Adverse side effects: None Hunger is moderately controlled.  Cravings are moderately controlled.  Assessment/Plan:   Jillian Ruiz endorses excessive hunger.  Medication(s): Wegovy  Effects of medication:  moderately controlled. Cravings are moderately controlled.   Plan: Medication(s): Wegovy  1.0 mg SQ weekly Will increase water , protein and fiber to help assuage hunger.  Will minimize foods that have a high glucose index/load to minimize reactive hypoglycemia.   Elevated LDL:   Her LDL was elevated at her last lab draw. She is working on her diet and exercise.   Plan: Will follow her lipids over time.   She will work on eating healthy fats.   Morbid Obesity: Current BMI BMI (Calculated): 42.93   Pharmacotherapy Plan Continue and increase dose  Wegovy  1.0 mg SQ weekly  Jillian Ruiz is currently in the action stage of change. As such, her goal is to continue with weight loss efforts.  She has agreed to the Category 4 plan.  Exercise goals: All adults should avoid inactivity. Some physical activity is better than none, and adults who participate in any amount of physical activity gain some health benefits.  Behavioral modification strategies: increasing lean protein intake, no meal skipping, meal planning , increase water  intake, better snacking choices, planning for success, increasing vegetables, avoiding temptations, weigh protein portions, measure portion sizes, and work on smaller portions.  Jillian Ruiz has agreed to follow-up with our clinic in 4 weeks.   No orders of the defined types were placed in this encounter.   Medications Discontinued During This Encounter  Medication Reason   semaglutide -weight management (WEGOVY ) 0.5 MG/0.5ML SOAJ SQ injection  Meds ordered this encounter  Medications   semaglutide -weight management (WEGOVY ) 1 MG/0.5ML SOAJ SQ injection    Sig: Inject 1 mg into the skin once a week.    Dispense:  2 mL    Refill:  0      Objective:   VITALS: Per patient if applicable, see vitals. GENERAL: Alert and in no acute distress. CARDIOPULMONARY: No increased WOB. Speaking in clear sentences.  PSYCH: Pleasant and cooperative. Speech normal rate and rhythm. Affect is appropriate. Insight and judgement are appropriate. Attention is focused, linear, and appropriate.  NEURO: Oriented as arrived to appointment on time with no prompting.   Attestation Statements:   This was prepared with the assistance of Engineer, civil (consulting).  Occasional wrong-word or sound-a-like substitutions may have occurred due to the inherent limitations of voice recognition   Clayborne Daring, DO

## 2023-10-24 ENCOUNTER — Telehealth: Payer: Self-pay

## 2023-10-24 NOTE — Telephone Encounter (Signed)
 Left message for patient to return call.

## 2023-10-30 ENCOUNTER — Encounter: Payer: Self-pay | Admitting: Bariatrics

## 2023-10-30 DIAGNOSIS — F411 Generalized anxiety disorder: Secondary | ICD-10-CM | POA: Diagnosis not present

## 2023-11-20 ENCOUNTER — Encounter: Payer: Self-pay | Admitting: Bariatrics

## 2023-11-20 ENCOUNTER — Ambulatory Visit: Admitting: Bariatrics

## 2023-11-20 VITALS — BP 113/74 | HR 89 | Temp 98.3°F | Ht 65.0 in | Wt 258.0 lb

## 2023-11-20 DIAGNOSIS — R632 Polyphagia: Secondary | ICD-10-CM

## 2023-11-20 DIAGNOSIS — Z6841 Body Mass Index (BMI) 40.0 and over, adult: Secondary | ICD-10-CM

## 2023-11-20 NOTE — Progress Notes (Signed)
 WEIGHT SUMMARY AND BIOMETRICS  Weight Lost Since Last Visit: 0  Weight Gained Since Last Visit: 0   Vitals Temp: 98.3 F (36.8 C) BP: 113/74 Pulse Rate: 89 SpO2: 100 %   Anthropometric Measurements Height: 5' 5 (1.651 m) Weight: 258 lb (117 kg) BMI (Calculated): 42.93 Weight at Last Visit: 258lb Weight Lost Since Last Visit: 0 Weight Gained Since Last Visit: 0 Starting Weight: 255lb Total Weight Loss (lbs): 0 lb (0 kg) Peak Weight: 298lb   Body Composition  Body Fat %: 48 % Fat Mass (lbs): 124.2 lbs Muscle Mass (lbs): 127.8 lbs Total Body Water  (lbs): 89.8 lbs Visceral Fat Rating : 13   Other Clinical Data Fasting: no Labs: no Today's Visit #: 13 Starting Date: 03/19/23    OBESITY Jillian Ruiz is here to discuss her progress with her obesity treatment plan along with follow-up of her obesity related diagnoses.    Nutrition Plan: the Category 4 plan - 40-50% adherence.  Current exercise: walking  Interim History:  Her weight remains the same.  Protein intake is less than prescribed., Is not skipping meals, and Water  intake is adequate.   Pharmacotherapy: Jillian Ruiz is was on the Wegovy  in the past.  Hunger is moderately controlled.  Cravings are moderately controlled.  Assessment/Plan:   Jillian Ruiz endorses excessive hunger.  Medication(s): Rybelsus  14 mg (has the RX).  Effects of medication:  moderately controlled. Cravings are moderately controlled.   Plan: Medication(s): Rybelsus  14 mg daily before breakfast (1/2 tablet for now, and will taper up to 14 mg).  Will increase water , protein and fiber to help assuage hunger.  Will minimize foods that have a high glucose index/load to minimize reactive hypoglycemia.  Will increase her fiber (fiber sheet).    Morbid Obesity: Current BMI BMI (Calculated): 42.93   Pharmacotherapy  Plan Continue  Rybelsus  14 mg daily before breakfast (1/2 of a pill).   Jillian Ruiz is currently in the action stage of change. As such, her goal is to continue with weight loss efforts.  She has agreed to the Category 4 plan.  Exercise goals: All adults should avoid inactivity. Some physical activity is better than none, and adults who participate in any amount of physical activity gain some health benefits. She is getting in adequate steps and resistance.   Behavioral modification strategies: meal planning , increase water  intake, better snacking choices, planning for success, increasing vegetables, increasing fiber rich foods, avoiding temptations, keep healthy foods in the home, weigh protein portions, measure portion sizes, and mindful eating.  Jillian Ruiz has agreed to follow-up with our clinic in 4 weeks.      Objective:   VITALS: Per patient if applicable, see vitals. GENERAL: Alert and in no acute distress. CARDIOPULMONARY: No increased WOB. Speaking in clear sentences.  PSYCH: Pleasant and cooperative. Speech normal rate and rhythm. Affect is appropriate. Insight and judgement are appropriate.  Attention is focused, linear, and appropriate.  NEURO: Oriented as arrived to appointment on time with no prompting.   Attestation Statements:   This was prepared with the assistance of Engineer, Civil (consulting).  Occasional wrong-word or sound-a-like substitutions may have occurred due to the inherent limitations of voice recognition   Clayborne Daring, DO

## 2023-12-05 DIAGNOSIS — F411 Generalized anxiety disorder: Secondary | ICD-10-CM | POA: Diagnosis not present

## 2023-12-19 ENCOUNTER — Ambulatory Visit: Admitting: Bariatrics

## 2023-12-25 ENCOUNTER — Encounter: Payer: Self-pay | Admitting: Bariatrics

## 2023-12-25 ENCOUNTER — Ambulatory Visit: Admitting: Bariatrics

## 2023-12-25 VITALS — BP 133/85 | HR 79 | Ht 65.0 in | Wt 263.0 lb

## 2023-12-25 DIAGNOSIS — Z6841 Body Mass Index (BMI) 40.0 and over, adult: Secondary | ICD-10-CM

## 2023-12-25 DIAGNOSIS — E66813 Obesity, class 3: Secondary | ICD-10-CM

## 2023-12-25 DIAGNOSIS — E559 Vitamin D deficiency, unspecified: Secondary | ICD-10-CM

## 2023-12-25 DIAGNOSIS — R632 Polyphagia: Secondary | ICD-10-CM

## 2023-12-25 MED ORDER — VITAMIN D (ERGOCALCIFEROL) 1.25 MG (50000 UNIT) PO CAPS: 50000.0000 [IU] | ORAL_CAPSULE | ORAL | 0 refills | Status: AC

## 2023-12-25 MED ORDER — PHENTERMINE-TOPIRAMATE ER 3.75-23 MG PO CP24: ORAL_CAPSULE | ORAL | 0 refills | Status: AC

## 2023-12-25 MED ORDER — RYBELSUS 14 MG PO TABS: 1.0000 | ORAL_TABLET | Freq: Every day | ORAL | 0 refills | Status: AC

## 2023-12-25 MED ORDER — PHENTERMINE-TOPIRAMATE ER 7.5-46 MG PO CP24: ORAL_CAPSULE | ORAL | 0 refills | Status: AC

## 2023-12-25 NOTE — Progress Notes (Signed)
 WEIGHT SUMMARY AND BIOMETRICS  Weight Lost Since Last Visit: 0  Weight Gained Since Last Visit: 5lb   Vitals BP: 133/85 Pulse Rate: 79 SpO2: 100 %   Anthropometric Measurements Height: 5' 5 (1.651 m) Weight: 263 lb (119.3 kg) BMI (Calculated): 43.77 Weight at Last Visit: 258lb Weight Lost Since Last Visit: 0 Weight Gained Since Last Visit: 5lb Starting Weight: 255lb Total Weight Loss (lbs): 0 lb (0 kg) Peak Weight: 298lb   Body Composition  Body Fat %: 49 % Fat Mass (lbs): 128.8 lbs Muscle Mass (lbs): 127.4 lbs Total Body Water  (lbs): 90.4 lbs Visceral Fat Rating : 14   Other Clinical Data Fasting: no Labs: no Today's Visit #: 14 Starting Date: 03/19/23    OBESITY Jillian Ruiz is here to discuss her progress with her obesity treatment plan along with follow-up of her obesity related diagnoses.    Nutrition Plan: the Category 4 plan - 25% adherence.  Current exercise: none  Interim History:  She is up 5 lbs since her last visit over the holiday.  Eating all of the food on the plan., Protein intake is as prescribed, Is not skipping meals, and Water  intake is adequate.   Pharmacotherapy: Roneshia is on Rybelsus  14 mg daily before breakfast Adverse side effects: None Hunger is moderately controlled.  Cravings are moderately controlled.  Assessment/Plan:   Vitamin D  Deficiency Vitamin D  is at goal of 50.  Most recent vitamin D  level was 56.4. She is on  prescription ergocalciferol  50,000 IU weekly. Lab Results  Component Value Date   VD25OH 56.4 09/19/2023   VD25OH 11.1 (L) 03/19/2023    Plan: Refill prescription vitamin D  50,000 IU weekly.   Polyphagia Klani endorses excessive hunger.  Medication(s): Rybelus 14 mg Effects of medication (appetite) :  moderately controlled, but stronger hunger. . Cravings are moderately controlled.    Plan: Medication(s): Qsymia 7.5/46 mg 1 capsule by mouth daily in am, and continue Rybelus.  Will increase water , protein and fiber to help assuage hunger.  Will minimize foods that have a high glucose index/load to minimize reactive hypoglycemia.   Will start Qsymia today. Checked the PDMP, and controlled medication sheet for Phentermine/Qsymia reviewed and signed with the patient. She/He denies contraindications. Benefits and risks were discussed.   Rx: Qsymia 3.75/23 mg capsule 1 daily #14 with no refills. Qsymia 7.5/46 mg 1 daily # 30 with no refills.   Morbid Obesity: Current BMI BMI (Calculated): 43.77   Pharmacotherapy Plan Continue and refill  Rybelsus  14 mg daily before breakfast She will start Qsymia 7.5/46 mg daily (new start).  [name] is currently in the action stage of change. As such, her goal is to continue with weight loss efforts.  She has agreed to the Category 4 plan.  Exercise goals: All adults should avoid inactivity. Some physical activity is better than none, and adults who participate in  any amount of physical activity gain some health benefits. She will continue her walking up to 10,000 steps per day.   Behavioral modification strategies: increasing lean protein intake, meal planning , increase water  intake, better snacking choices, planning for success, increasing vegetables, avoiding temptations, keep healthy foods in the home, weigh protein portions, work on smaller portions, and mindful eating.  [name] has agreed to follow-up with our clinic in 4 weeks.    Objective:   VITALS: Per patient if applicable, see vitals. GENERAL: Alert and in no acute distress. CARDIOPULMONARY: No increased WOB. Speaking in clear sentences.  PSYCH: Pleasant and cooperative. Speech normal rate and rhythm. Affect is appropriate. Insight and judgement are appropriate. Attention is focused, linear, and appropriate.  NEURO: Oriented as arrived to appointment on time with no  prompting.   Attestation Statements:   This was prepared with the assistance of Engineer, Civil (consulting).  Occasional wrong-word or sound-a-like substitutions may have occurred due to the inherent limitations of voice recognition  Clayborne Daring, DO

## 2024-01-01 DIAGNOSIS — F411 Generalized anxiety disorder: Secondary | ICD-10-CM | POA: Diagnosis not present

## 2024-01-24 ENCOUNTER — Telehealth: Payer: Self-pay

## 2024-01-24 NOTE — Progress Notes (Unsigned)
 Complex Care Management Note Care Guide Note  01/24/2024 Name: Jillian Ruiz MRN: [id number] DOB: [birth year]   Complex Care Management Outreach Attempts: An unsuccessful telephone outreach was attempted today to offer the patient information about available complex care management services.  Follow Up Plan:  Additional outreach attempts will be made to offer the patient complex care management information and services.   Encounter Outcome:  No Answer  Jeoffrey Buffalo , RMA     [place]  [facility], [facility] Guide  Direct Dial: [phone]  Website: [email or web address]

## 2024-01-27 ENCOUNTER — Ambulatory Visit: Admitting: Bariatrics

## 2024-02-20 ENCOUNTER — Encounter: Payer: Self-pay | Admitting: Bariatrics
